# Patient Record
Sex: Male | Born: 1950 | Race: White | Hispanic: No | Marital: Married | Smoking: Never smoker
Health system: Southern US, Community
[De-identification: ages and names within clinical notes are randomized; demographics above are authoritative.]

## PROBLEM LIST (undated history)

## (undated) DIAGNOSIS — E7800 Pure hypercholesterolemia, unspecified: Secondary | ICD-10-CM

## (undated) DIAGNOSIS — I10 Essential (primary) hypertension: Secondary | ICD-10-CM

## (undated) HISTORY — PX: SPLENECTOMY: SUR1306

---

## 2011-03-16 DIAGNOSIS — D229 Melanocytic nevi, unspecified: Secondary | ICD-10-CM

## 2011-03-16 DIAGNOSIS — C4491 Basal cell carcinoma of skin, unspecified: Secondary | ICD-10-CM

## 2011-03-16 HISTORY — DX: Melanocytic nevi, unspecified: D22.9

## 2011-03-16 HISTORY — DX: Basal cell carcinoma of skin, unspecified: C44.91

## 2011-12-02 DIAGNOSIS — C4491 Basal cell carcinoma of skin, unspecified: Secondary | ICD-10-CM

## 2011-12-02 HISTORY — DX: Basal cell carcinoma of skin, unspecified: C44.91

## 2016-07-03 ENCOUNTER — Emergency Department (HOSPITAL_COMMUNITY): Payer: Managed Care, Other (non HMO)

## 2016-07-03 ENCOUNTER — Observation Stay (HOSPITAL_COMMUNITY)
Admission: EM | Admit: 2016-07-03 | Discharge: 2016-07-04 | Disposition: A | Discharge status: Home / Self Care | Payer: Managed Care, Other (non HMO) | Attending: Internal Medicine | Admitting: Internal Medicine

## 2016-07-03 ENCOUNTER — Encounter (HOSPITAL_COMMUNITY): Payer: Self-pay | Admitting: Emergency Medicine

## 2016-07-03 DIAGNOSIS — I10 Essential (primary) hypertension: Secondary | ICD-10-CM | POA: Diagnosis not present

## 2016-07-03 DIAGNOSIS — Z23 Encounter for immunization: Secondary | ICD-10-CM | POA: Diagnosis not present

## 2016-07-03 DIAGNOSIS — T63441A Toxic effect of venom of bees, accidental (unintentional), initial encounter: Principal | ICD-10-CM | POA: Insufficient documentation

## 2016-07-03 DIAGNOSIS — R079 Chest pain, unspecified: Secondary | ICD-10-CM | POA: Insufficient documentation

## 2016-07-03 DIAGNOSIS — Z8249 Family history of ischemic heart disease and other diseases of the circulatory system: Secondary | ICD-10-CM | POA: Insufficient documentation

## 2016-07-03 DIAGNOSIS — N179 Acute kidney failure, unspecified: Secondary | ICD-10-CM | POA: Diagnosis not present

## 2016-07-03 DIAGNOSIS — Z87891 Personal history of nicotine dependence: Secondary | ICD-10-CM | POA: Diagnosis not present

## 2016-07-03 DIAGNOSIS — R7989 Other specified abnormal findings of blood chemistry: Secondary | ICD-10-CM | POA: Diagnosis not present

## 2016-07-03 DIAGNOSIS — E7800 Pure hypercholesterolemia, unspecified: Secondary | ICD-10-CM | POA: Diagnosis not present

## 2016-07-03 DIAGNOSIS — T782XXA Anaphylactic shock, unspecified, initial encounter: Secondary | ICD-10-CM

## 2016-07-03 DIAGNOSIS — E785 Hyperlipidemia, unspecified: Secondary | ICD-10-CM | POA: Diagnosis not present

## 2016-07-03 HISTORY — DX: Pure hypercholesterolemia, unspecified: E78.00

## 2016-07-03 HISTORY — DX: Essential (primary) hypertension: I10

## 2016-07-03 LAB — CBC WITH DIFFERENTIAL/PLATELET
BASOS PCT: 0 %
Basophils Absolute: 0 10*3/uL (ref 0.0–0.1)
Eosinophils Absolute: 0.1 10*3/uL (ref 0.0–0.7)
Eosinophils Relative: 1 %
HEMATOCRIT: 46.6 % (ref 39.0–52.0)
HEMOGLOBIN: 15.3 g/dL (ref 13.0–17.0)
LYMPHS ABS: 3.9 10*3/uL (ref 0.7–4.0)
LYMPHS PCT: 35 %
MCH: 30.5 pg (ref 26.0–34.0)
MCHC: 32.8 g/dL (ref 30.0–36.0)
MCV: 92.8 fL (ref 78.0–100.0)
MONO ABS: 0.3 10*3/uL (ref 0.1–1.0)
MONOS PCT: 3 %
NEUTROS ABS: 6.7 10*3/uL (ref 1.7–7.7)
Neutrophils Relative %: 61 %
Platelets: 275 10*3/uL (ref 150–400)
RBC: 5.02 MIL/uL (ref 4.22–5.81)
RDW: 13.4 % (ref 11.5–15.5)
WBC: 11 10*3/uL — ABNORMAL HIGH (ref 4.0–10.5)

## 2016-07-03 LAB — COMPREHENSIVE METABOLIC PANEL
ALK PHOS: 63 U/L (ref 38–126)
ALT: 29 U/L (ref 17–63)
AST: 27 U/L (ref 15–41)
Albumin: 3.3 g/dL — ABNORMAL LOW (ref 3.5–5.0)
Anion gap: 12 (ref 5–15)
BILIRUBIN TOTAL: 0.6 mg/dL (ref 0.3–1.2)
BUN: 18 mg/dL (ref 6–20)
CALCIUM: 8.8 mg/dL — AB (ref 8.9–10.3)
CO2: 19 mmol/L — AB (ref 22–32)
CREATININE: 1.32 mg/dL — AB (ref 0.61–1.24)
Chloride: 111 mmol/L (ref 101–111)
GFR, EST NON AFRICAN AMERICAN: 55 mL/min — AB (ref >60)
Glucose, Bld: 129 mg/dL — ABNORMAL HIGH (ref 65–99)
Potassium: 3.5 mmol/L (ref 3.5–5.1)
Sodium: 142 mmol/L (ref 135–145)
Total Protein: 6.3 g/dL — ABNORMAL LOW (ref 6.5–8.1)

## 2016-07-03 LAB — I-STAT TROPONIN, ED: Troponin i, poc: 0.15 ng/mL (ref 0.00–0.08)

## 2016-07-03 LAB — ETHANOL: Alcohol, Ethyl (B): <5 mg/dL (ref <5)

## 2016-07-03 LAB — TROPONIN I: TROPONIN I: 0.86 ng/mL — AB (ref <0.03)

## 2016-07-03 MED ORDER — ASPIRIN EC 81 MG PO TBEC
81.0000 mg | DELAYED_RELEASE_TABLET | Freq: Every day | ORAL | Status: DC
  Administered 2016-07-04: 81 mg via ORAL
  Filled 2016-07-03: qty 1

## 2016-07-03 MED ORDER — SODIUM CHLORIDE 0.9 % IV BOLUS (SEPSIS)
500.0000 mL | Freq: Once | INTRAVENOUS | Status: AC
Start: 2016-07-03 — End: 2016-07-03
  Administered 2016-07-03: 500 mL via INTRAVENOUS

## 2016-07-03 MED ORDER — SODIUM CHLORIDE 0.9 % IV BOLUS (SEPSIS)
1000.0000 mL | Freq: Once | INTRAVENOUS | Status: AC
  Administered 2016-07-03: 1000 mL via INTRAVENOUS

## 2016-07-03 MED ORDER — ENOXAPARIN SODIUM 40 MG/0.4ML ~~LOC~~ SOLN
40.0000 mg | Freq: Every day | SUBCUTANEOUS | Status: DC
  Administered 2016-07-03: 40 mg via SUBCUTANEOUS
  Filled 2016-07-03: qty 0.4

## 2016-07-03 MED ORDER — ACETAMINOPHEN 325 MG PO TABS: 650.0000 mg | ORAL_TABLET | Freq: Four times a day (QID) | ORAL | Status: DC | PRN

## 2016-07-03 MED ORDER — METHYLPREDNISOLONE SODIUM SUCC 125 MG IJ SOLR
125.0000 mg | Freq: Once | INTRAMUSCULAR | Status: AC
  Administered 2016-07-03: 125 mg via INTRAVENOUS
  Filled 2016-07-03: qty 2

## 2016-07-03 MED ORDER — SODIUM CHLORIDE 0.9% FLUSH
3.0000 mL | Freq: Two times a day (BID) | INTRAVENOUS | Status: DC
  Administered 2016-07-03 – 2016-07-04 (×2): 3 mL via INTRAVENOUS

## 2016-07-03 MED ORDER — DIPHENHYDRAMINE HCL 50 MG/ML IJ SOLN
25.0000 mg | Freq: Once | INTRAMUSCULAR | Status: AC
  Administered 2016-07-03: 25 mg via INTRAVENOUS
  Filled 2016-07-03: qty 1

## 2016-07-03 MED ORDER — METHYLPREDNISOLONE SODIUM SUCC 125 MG IJ SOLR
60.0000 mg | Freq: Four times a day (QID) | INTRAMUSCULAR | Status: DC
  Administered 2016-07-03 – 2016-07-04 (×3): 60 mg via INTRAVENOUS
  Filled 2016-07-03 (×3): qty 2

## 2016-07-03 MED ORDER — HYDROCODONE-ACETAMINOPHEN 5-325 MG PO TABS: 1.0000 | ORAL_TABLET | ORAL | Status: DC | PRN

## 2016-07-03 MED ORDER — ONDANSETRON HCL 4 MG/2ML IJ SOLN: 4.0000 mg | Freq: Four times a day (QID) | INTRAMUSCULAR | Status: DC | PRN

## 2016-07-03 MED ORDER — PNEUMOCOCCAL VAC POLYVALENT 25 MCG/0.5ML IJ INJ
0.5000 mL | INJECTION | INTRAMUSCULAR | Status: AC
  Administered 2016-07-04: 0.5 mL via INTRAMUSCULAR
  Filled 2016-07-03: qty 0.5

## 2016-07-03 MED ORDER — ACETAMINOPHEN 650 MG RE SUPP: 650.0000 mg | Freq: Four times a day (QID) | RECTAL | Status: DC | PRN

## 2016-07-03 MED ORDER — ONDANSETRON HCL 4 MG PO TABS: 4.0000 mg | ORAL_TABLET | Freq: Four times a day (QID) | ORAL | Status: DC | PRN

## 2016-07-03 MED ORDER — ACETAMINOPHEN 325 MG PO TABS
650.0000 mg | ORAL_TABLET | ORAL | Status: DC | PRN
Start: 2016-07-03 — End: 2016-07-03

## 2016-07-03 MED ORDER — DIPHENHYDRAMINE HCL 50 MG/ML IJ SOLN: 25.0000 mg | Freq: Three times a day (TID) | INTRAMUSCULAR | Status: DC | PRN

## 2016-07-03 MED ORDER — DIPHENHYDRAMINE HCL 50 MG/ML IJ SOLN
12.5000 mg | Freq: Four times a day (QID) | INTRAMUSCULAR | Status: DC
  Administered 2016-07-03 – 2016-07-04 (×3): 12.5 mg via INTRAVENOUS
  Filled 2016-07-03 (×3): qty 1

## 2016-07-03 MED ORDER — MORPHINE SULFATE (PF) 2 MG/ML IV SOLN: 2.0000 mg | INTRAVENOUS | Status: DC | PRN

## 2016-07-03 MED ORDER — EPINEPHRINE 0.3 MG/0.3ML IJ SOAJ
0.3000 mg | INTRAMUSCULAR | Status: DC | PRN
  Filled 2016-07-03: qty 0.3

## 2016-07-03 MED ORDER — FAMOTIDINE IN NACL 20-0.9 MG/50ML-% IV SOLN
20.0000 mg | Freq: Two times a day (BID) | INTRAVENOUS | Status: DC
Start: 2016-07-03 — End: 2016-07-04
  Administered 2016-07-03 – 2016-07-04 (×2): 20 mg via INTRAVENOUS
  Filled 2016-07-03 (×2): qty 50

## 2016-07-03 MED ORDER — SODIUM CHLORIDE 0.9 % IV SOLN
INTRAVENOUS | Status: AC
  Administered 2016-07-03: 23:00:00 via INTRAVENOUS

## 2016-07-03 NOTE — Consult Note (Addendum)
Admit date: 07/03/2016 Referring Physician  Dr. Roel Cluck Primary Physician  None Primary Cardiologist  None Reason for Consultation  Chest pain with anaphylaxis  HPI: This is a 65yo male with no prior cardiac history but a history of hyperlipidemia and HTN who presented to [facility] ER with anaphylaxis after being stung by a bee.  He was out mowing his lawn and was stung by one bee and immediately he became very diaphoretic with severe SOB and facial swelling. He says that he could not breath at all and started having chest tightness.  His wife says that he started to turn blue.  She was able to get a small amount of benadryl in him but then he stopped breathing and turned blue and eye rolled back in his head.  Fortunately EMS was driving down his street when they got the call and on arrival  he was hypotensive and was given epinephrine. He regained consciousness and was transported to ER.   In ER hypotension resolved but he has some residual facial swelling.  WBC 11, creatinine 1.32.  Initial POC trop was 0.15.  Currently pain free.  EKG is nonischemic.  Cardiology is asked to evaluate for further workup of elevated troponin.  He says that he has been allergic to bee stings in the past and each time his reaction is more severe.       PMH:   Past Medical History  Diagnosis Date  . High cholesterol   . Hypertension      PSH:   Past Surgical History  Procedure Laterality Date  . Splenectomy      Allergies:  Bee venom Prior to Admit Meds:   (Not in a hospital admission) Fam HX:    Family History  Problem Relation Age of Onset  . Diabetes Mother   . Rheumatic fever Father   . CAD Other    Social HX:    Social History   Social History  . Marital Status: Unknown    Spouse Name: N/A  . Number of Children: N/A  . Years of Education: N/A   Occupational History  . Not on file.   Social History Main Topics  . Smoking status: Never Smoker   . Smokeless tobacco: Not on file  . Alcohol  Use: Yes     Comment: socially  . Drug Use: No  . Sexual Activity: Not on file   Other Topics Concern  . Not on file   Social History Narrative  . No narrative on file     ROS:  All 11 ROS were addressed and are negative except what is stated in the HPI  Physical Exam: Blood pressure 109/69, pulse 71, temperature 97.6 F (36.4 C), temperature source Oral, resp. rate 17, height 5\' 8"  (1.727 m), weight 220 lb (99.791 kg), SpO2 97 %.    General: Well developed, well nourished, in no acute distress Head: Eyes PERRLA, No xanthomas.   Normal cephalic and atramatic  Lungs:   Clear bilaterally to auscultation and percussion. Heart:  HRRR S1 S2 Pulses are 2+ & equal.            No carotid bruit. No JVD.  No abdominal bruits. No femoral bruits. Abdomen: Bowel sounds are positive, abdomen soft and non-tender without masses  Msk:  Back normal, normal gait. Normal strength and tone for age. Extremities:   No clubbing, cyanosis or edema.  DP +1 Neuro: Alert and oriented X 3. Psych:  Good affect, responds appropriately  Labs:   Lab Results  Component Value Date   WBC 11.0* 07/03/2016   HGB 15.3 07/03/2016   HCT 46.6 07/03/2016   MCV 92.8 07/03/2016   PLT 275 07/03/2016    Recent Labs Lab 07/03/16 1818  NA 142  K 3.5  CL 111  CO2 19*  BUN 18  CREATININE 1.32*  CALCIUM 8.8*  PROT 6.3*  BILITOT 0.6  ALKPHOS 63  ALT 29  AST 27  GLUCOSE 129*   No results found for: PTT No results found for: INR, PROTIME Lab Results  Component Value Date   TROPONINI 0.86* 07/03/2016    No results found for: CHOL No results found for: HDL No results found for: LDLCALC No results found for: TRIG No results found for: CHOLHDL No results found for: LDLDIRECT    Radiology:  Dg Chest Port 1 View  07/03/2016  CLINICAL DATA:  Shortness of breath. EXAM: PORTABLE CHEST 1 VIEW COMPARISON:  None. FINDINGS: Heart size is normal. Aortic atherosclerosis noted. No pleural effusion identified.  Atelectasis is noted in the left lung base. No airspace consolidation. IMPRESSION: 1. Left base atelectasis. Electronically Signed   By: Kerby Moors M.D.   On: 07/03/2016 18:12    EKG:  NSR with no ST changes  ASSESSMENT/PLAN:   1.  Severe anaphylaxis with associated hypotension and near arrest after bee sting s/p epinephrine with residual facial swelling. 2.  Chest pain with elevated troponin at 0.15.  This is most likely related to demand ischemia in the setting of acute anaphylaxis, hypotension and epinephrine.  He is now pain free.  Second trop mildly elevated at 0.86.  EKG is nonischemic.  I do not think this represents an acute coronary syndrome. No IV Heparin for now. Would continue to cycle enzymes.  Check 2D echo in am to assess LVF.  His cardiac risk factors include HTN, family history of his father having a CABG at 30yo, and dyslipidemia.  He also used to smoke but quit 15 years ago.  If troponin remains flat and echo is normal, recommend outpt nuclear stress test.  3.  HTN - controlled on no home meds.  Fransico Him, MD  07/03/2016  9:23 PM

## 2016-07-03 NOTE — H&P (Signed)
Andrew Heath [id number] DOB: 06/16/1951 DOA: 07/03/2016     PCP: Verlon [facility] Outpatient Specialists: none Patient coming from:    home Lives   With family    Chief Complaint: bee sting and chest pain  HPI: Andrew Heath is a 65 y.o. male with medical history significant of mild hypercholesteremia    Presented after he was stung by P today while mowing the yard. Family noticed him to be pale and diaphoretic With  hives EMS was called and patient was noted to be hypotensive incontinent of bowels and bladder. Wife noted he was turning blue. He was pointing to his chest. She gave him liquid benadryl.  He states he was very confused out of strength and felt he could not get any air.  Patient received 0.3 of EPinephrine at 1647 thereafter he developed chest pain received aspirin 300 2450 of Benadryl after that hives have resolved he was able to speak in complete sentences and was brought to emergency department.   Patient states he has been bit by yellow jacket in the past and developed milder reaction. Patient reports that he is very active placed tenderness and does not have any exertional chest pain or shortness of breath at baseline. Wife states that a year ago he collapsed while playing tennis but patient states that was secondary to heat exertion. He denies any history of coronary artery disease hypertension no family history of early deaths   IN ER: Afebrile heart rate 65 respirations 15 blood pressure 143/67 oxygen saturation 100% on room air WBC 11.0 hemoglobin 15.5 bicarbonate 19 creatinine 1.3 to glucose 129 Troponin elevated 0.15  X-ray showing left basilar atelectasis Cardiology has been consulted and provider spoke with Dr. Radford Pax who recommends repeating troponin and she was seen in consult Hospitalist was called for admission for chest pain and elevated troponin  Review of Systems:    Pertinent positives include: chest pain,   Constitutional:  No  weight loss, night sweats, Fevers, chills, fatigue, weight loss  HEENT:  No headaches, Difficulty swallowing,Tooth/dental problems,Sore throat,  No sneezing, itching, ear ache, nasal congestion, post nasal drip,  Cardio-vascular:  No Orthopnea, PND, anasarca, dizziness, palpitations.no Bilateral lower extremity swelling  GI:  No heartburn, indigestion, abdominal pain, nausea, vomiting, diarrhea, change in bowel habits, loss of appetite, melena, blood in stool, hematemesis Resp:  no shortness of breath at rest. No dyspnea on exertion, No excess mucus, no productive cough, No non-productive cough, No coughing up of blood.No change in color of mucus.No wheezing. Skin:  no rash or lesions. No jaundice GU:  no dysuria, change in color of urine, no urgency or frequency. No straining to urinate.  No flank pain.  Musculoskeletal:  No joint pain or no joint swelling. No decreased range of motion. No back pain.  Psych:  No change in mood or affect. No depression or anxiety. No memory loss.  Neuro: no localizing neurological complaints, no tingling, no weakness, no double vision, no gait abnormality, no slurred speech, no confusion  As per HPI otherwise 10 point review of systems negative.   Past Medical History: Past Medical History  Diagnosis Date  . High cholesterol   . Hypertension    Past Surgical History  Procedure Laterality Date  . Splenectomy       Social History:  Ambulatory  independently      reports that he has never smoked. He does not have any smokeless tobacco history on file. He reports that he drinks alcohol.  He reports that he does not use illicit drugs.  Allergies:   Allergies  Allergen Reactions  . Bee Venom     Family History:    Family History  Problem Relation Age of Onset  . Diabetes Mother   . Rheumatic fever Father   . CAD Other     Medications: Prior to Admission medications   Not on File    Physical Exam: Patient Vitals for the past 24  hrs:  BP Temp Temp src Pulse Resp SpO2 Height Weight  07/03/16 1900 143/67 mmHg - - 65 15 100 % - -  07/03/16 1830 129/79 mmHg - - 63 17 99 % - -  07/03/16 1800 156/76 mmHg - - 64 20 98 % - -  07/03/16 1749 172/80 mmHg 97.6 F (36.4 C) Oral 62 14 97 % - -  07/03/16 1747 - - - - - - 5\' 8"  (1.727 m) 99.791 kg (220 lb)  07/03/16 1742 - - - - - 93 % - -    1. General:  in No Acute distress 2. Psychological: Alert and   Oriented 3. Head/ENT:     Dry Mucous Membranes                          Head Non traumatic, neck supple                          Normal Dentition 4. SKIN:   decreased Skin turgor,  Skin clean Dry and intact no rash, no hives noted some skin damage from sun noted 5. Heart: Regular rate and rhythm no  Murmur, Rub or gallop 6. Lungs: Clear to auscultation bilaterally, no wheezes or crackles   7. Abdomen: Soft, non-tender, Non distended 8. Lower extremities: no clubbing, cyanosis, or edema 9. Neurologically Grossly intact, moving all 4 extremities equally 10. MSK: Normal range of motion   body mass index is 33.46 kg/(m^2).  Labs on Admission:   Labs on Admission: I have personally reviewed following labs and imaging studies  CBC:  Recent Labs Lab 07/03/16 1818  WBC 11.0*  NEUTROABS 6.7  HGB 15.3  HCT 46.6  MCV 92.8  PLT 123XX123   Basic Metabolic Panel:  Recent Labs Lab 07/03/16 1818  NA 142  K 3.5  CL 111  CO2 19*  GLUCOSE 129*  BUN 18  CREATININE 1.32*  CALCIUM 8.8*   GFR: Estimated Creatinine Clearance: 63.1 mL/min (by C-G formula based on Cr of 1.32). Liver Function Tests:  Recent Labs Lab 07/03/16 1818  AST 27  ALT 29  ALKPHOS 63  BILITOT 0.6  PROT 6.3*  ALBUMIN 3.3*   No results for input(s): LIPASE, AMYLASE in the last 168 hours. No results for input(s): AMMONIA in the last 168 hours. Coagulation Profile: No results for input(s): INR, PROTIME in the last 168 hours. Cardiac Enzymes: No results for input(s): CKTOTAL, CKMB, CKMBINDEX,  TROPONINI in the last 168 hours. BNP (last 3 results) No results for input(s): PROBNP in the last 8760 hours. HbA1C: No results for input(s): HGBA1C in the last 72 hours. CBG: No results for input(s): GLUCAP in the last 168 hours. Lipid Profile: No results for input(s): CHOL, HDL, LDLCALC, TRIG, CHOLHDL, LDLDIRECT in the last 72 hours. Thyroid Function Tests: No results for input(s): TSH, T4TOTAL, FREET4, T3FREE, THYROIDAB in the last 72 hours. Anemia Panel: No results for input(s): VITAMINB12, FOLATE, FERRITIN, TIBC, IRON, RETICCTPCT in the  last 72 hours. Urine analysis: No results found for: COLORURINE, APPEARANCEUR, LABSPEC, PHURINE, GLUCOSEU, HGBUR, BILIRUBINUR, KETONESUR, PROTEINUR, UROBILINOGEN, NITRITE, LEUKOCYTESUR Sepsis Labs: @LABRCNTIP (procalcitonin:4,lacticidven:4) )No results found for this or any previous visit (from the past 240 hour(s)).    UA not obtained  No results found for: HGBA1C  Estimated Creatinine Clearance: 63.1 mL/min (by C-G formula based on Cr of 1.32).  BNP (last 3 results) No results for input(s): PROBNP in the last 8760 hours.   ECG REPORT  Independently reviewed Rate: 63  Rhythm: Sinus rhythm ST&T Change: No acute ischemic changes   QTC 434  Filed Weights   07/03/16 1747  Weight: 99.791 kg (220 lb)     Cultures: No results found for: SDES, SPECREQUEST, CULT, REPTSTATUS   Radiological Exams on Admission: Dg Chest Port 1 View  07/03/2016  CLINICAL DATA:  Shortness of breath. EXAM: PORTABLE CHEST 1 VIEW COMPARISON:  None. FINDINGS: Heart size is normal. Aortic atherosclerosis noted. No pleural effusion identified. Atelectasis is noted in the left lung base. No airspace consolidation. IMPRESSION: 1. Left base atelectasis. Electronically Signed   By: Kerby Moors M.D.   On: 07/03/2016 18:12    Chart has been reviewed    Assessment/Plan  65 y.o. male with medical history significant of mild hypercholesteremia here with anaphylactic  reaction secondary to bee sting with associated chest pain and elevated troponin  Present on Admission:  . Chest pain - any setting of anaphylactic reaction. Currently chest pain-free. We'll monitor carefully and step down continue to cycle cardiac enzymes appreciate cardiology consult, monitor on telemetry obtain echogram.  . Anaphylactic reaction  to bee sting- will admit to step down to monitor for recurrent reaction. Continue steroids scheduled Solu-Medrol, Pepcid, Benadryl, EpiPen at bedside, rehydrate aggressively, patient will need prescription for EpiPen at the time of discharge.  . Hypercholesteremia we'll check lipid panel   elevated troponin in a setting of anaphylactic reaction versus possible shock. Unsure which show ACS. Appreciate cardiology consult. Hold off on heparinization at this point.  . Acute renal injury ([place]) low rehydrate and check urine electrolytes  Other plan as per orders.  DVT prophylaxis:    Lovenox     Code Status:  FULL CODE  as per patient    Family Communication:   Family  at  Bedside  plan of care was discussed with  Wife Andrew Heath [id number]  Disposition Plan:      To home once workup is complete and patient is stable   Consults called: Cardiology   Admission status:    obs    Level of care    stepdown   I have spent a total of 57 min on this admission    Alaine Loughney 07/03/2016, 9:02 PM    [facility]  Pager [phone]   after 2 AM please page floor coverage PA If 7AM-7PM, please contact the day team taking care of the patient  [email or web address]  Password [email or web address]

## 2016-07-03 NOTE — ED Notes (Signed)
Patient notified of delay - awaiting bed assignment.

## 2016-07-03 NOTE — ED Provider Notes (Signed)
CSN: [id number]     Arrival date & time 07/03/16  61 History   First MD Initiated Contact with Patient 07/03/16 1747     Chief Complaint  Patient presents with  . Allergic Reaction  . Chest Pain     (Consider location/radiation/quality/duration/timing/severity/associated sxs/prior Treatment) Patient is a 65 y.o. male presenting with allergic reaction and chest pain. The history is provided by the patient (The patient was stung by bees and became diaphoretic and hypotensive. He was given epinephrine by the paramedics. Started to have some chest pain in the ambulance.).  Allergic Reaction Presenting symptoms: difficulty breathing and rash   Severity:  Severe Prior allergic episodes:  Insect allergies Context: no animal exposure   Relieved by: Epinephrine. Worsened by:  Nothing tried Chest Pain Associated symptoms: no abdominal pain, no back pain, no cough, no fatigue and no headache     Past Medical History  Diagnosis Date  . High cholesterol   . Hypertension    Past Surgical History  Procedure Laterality Date  . Splenectomy     History reviewed. No pertinent family history. Social History  Substance Use Topics  . Smoking status: Never Smoker   . Smokeless tobacco: None  . Alcohol Use: Yes     Comment: socially    Review of Systems  Constitutional: Negative for appetite change and fatigue.  HENT: Negative for congestion, ear discharge and sinus pressure.   Eyes: Negative for discharge.  Respiratory: Negative for cough.   Cardiovascular: Positive for chest pain.  Gastrointestinal: Negative for abdominal pain and diarrhea.  Genitourinary: Negative for frequency and hematuria.  Musculoskeletal: Negative for back pain.  Skin: Positive for rash.  Neurological: Negative for seizures and headaches.  Psychiatric/Behavioral: Negative for hallucinations.      Allergies  Bee venom  Home Medications   Prior to Admission medications   Not on File   BP 132/70 mmHg   Pulse 66  Temp(Src) 97.6 F (36.4 C) (Oral)  Resp 19  Ht 5\' 8"  (1.727 m)  Wt 220 lb (99.791 kg)  BMI 33.46 kg/m2  SpO2 96% Physical Exam  Constitutional: He is oriented to person, place, and time. He appears well-developed.  HENT:  Head: Normocephalic.  Swelling around face  Eyes: Conjunctivae and EOM are normal. No scleral icterus.  Neck: Neck supple. No thyromegaly present.  Cardiovascular: Normal rate and regular rhythm.  Exam reveals no gallop and no friction rub.   No murmur heard. Pulmonary/Chest: No stridor. He has no wheezes. He has no rales. He exhibits no tenderness.  Abdominal: He exhibits no distension. There is no tenderness. There is no rebound.  Musculoskeletal: Normal range of motion. He exhibits no edema.  Lymphadenopathy:    He has no cervical adenopathy.  Neurological: He is oriented to person, place, and time. He exhibits normal muscle tone. Coordination normal.  Skin: Rash noted. No erythema.  Psychiatric: He has a normal mood and affect. His behavior is normal.    ED Course  Procedures (including critical care time) Labs Review Labs Reviewed  CBC WITH DIFFERENTIAL/PLATELET - Abnormal; Notable for the following:    WBC 11.0 (*)    All other components within normal limits  COMPREHENSIVE METABOLIC PANEL - Abnormal; Notable for the following:    CO2 19 (*)    Glucose, Bld 129 (*)    Creatinine, Ser 1.32 (*)    Calcium 8.8 (*)    Total Protein 6.3 (*)    Albumin 3.3 (*)    GFR calc  non Af Amer 55 (*)    All other components within normal limits  I-STAT TROPOININ, ED - Abnormal; Notable for the following:    Troponin i, poc 0.15 (*)    All other components within normal limits  ETHANOL  TROPONIN I    Imaging Review Dg Chest Port 1 View  07/03/2016  CLINICAL DATA:  Shortness of breath. EXAM: PORTABLE CHEST 1 VIEW COMPARISON:  None. FINDINGS: Heart size is normal. Aortic atherosclerosis noted. No pleural effusion identified. Atelectasis is noted in  the left lung base. No airspace consolidation. IMPRESSION: 1. Left base atelectasis. Electronically Signed   By: Kerby Moors M.D.   On: 07/03/2016 18:12   I have personally reviewed and evaluated these images and lab results as part of my medical decision-making.   EKG Interpretation None      MDM   Final diagnoses:  None    Patient with an anaphylactic reaction to bee sting. Also elevated troponin. Patient will be admitted to the hospital for further evaluation    Milton Ferguson, MD 07/03/16 2030

## 2016-07-03 NOTE — ED Notes (Signed)
Per EMS- Pt was mowing the lawn and got stung by a bee. Pt was weak, hypotensive, diaphoretic, hives. Pt also was incontinent of bowels and bladder.Pt received 0.3 epi at 1647, 324 ASA, 50 benadryl. Pt speaking in complete sentences at this time, no visible rash.

## 2016-07-03 NOTE — ED Notes (Signed)
Admitting MD at bedside.

## 2016-07-04 ENCOUNTER — Observation Stay (HOSPITAL_COMMUNITY): Payer: Managed Care, Other (non HMO)

## 2016-07-04 ENCOUNTER — Other Ambulatory Visit: Payer: Self-pay | Admitting: Cardiology

## 2016-07-04 ENCOUNTER — Observation Stay (HOSPITAL_BASED_OUTPATIENT_CLINIC_OR_DEPARTMENT_OTHER): Payer: Managed Care, Other (non HMO)

## 2016-07-04 DIAGNOSIS — R079 Chest pain, unspecified: Secondary | ICD-10-CM

## 2016-07-04 DIAGNOSIS — N179 Acute kidney failure, unspecified: Secondary | ICD-10-CM

## 2016-07-04 DIAGNOSIS — R778 Other specified abnormalities of plasma proteins: Secondary | ICD-10-CM

## 2016-07-04 DIAGNOSIS — E7800 Pure hypercholesterolemia, unspecified: Secondary | ICD-10-CM

## 2016-07-04 DIAGNOSIS — T63441A Toxic effect of venom of bees, accidental (unintentional), initial encounter: Secondary | ICD-10-CM | POA: Diagnosis not present

## 2016-07-04 DIAGNOSIS — R7989 Other specified abnormal findings of blood chemistry: Principal | ICD-10-CM

## 2016-07-04 DIAGNOSIS — T782XXA Anaphylactic shock, unspecified, initial encounter: Secondary | ICD-10-CM

## 2016-07-04 LAB — COMPREHENSIVE METABOLIC PANEL
ALK PHOS: 57 U/L (ref 38–126)
ALT: 30 U/L (ref 17–63)
AST: 29 U/L (ref 15–41)
Albumin: 3.5 g/dL (ref 3.5–5.0)
Anion gap: 9 (ref 5–15)
BUN: 19 mg/dL (ref 6–20)
CHLORIDE: 111 mmol/L (ref 101–111)
CO2: 19 mmol/L — AB (ref 22–32)
CREATININE: 1.05 mg/dL (ref 0.61–1.24)
Calcium: 8.8 mg/dL — ABNORMAL LOW (ref 8.9–10.3)
GFR calc non Af Amer: >60 mL/min (ref >60)
GLUCOSE: 141 mg/dL — AB (ref 65–99)
Potassium: 4.5 mmol/L (ref 3.5–5.1)
Sodium: 139 mmol/L (ref 135–145)
Total Bilirubin: 0.7 mg/dL (ref 0.3–1.2)
Total Protein: 6.2 g/dL — ABNORMAL LOW (ref 6.5–8.1)

## 2016-07-04 LAB — CBC
HCT: 42.6 % (ref 39.0–52.0)
HEMOGLOBIN: 13.9 g/dL (ref 13.0–17.0)
MCH: 30.4 pg (ref 26.0–34.0)
MCHC: 32.6 g/dL (ref 30.0–36.0)
MCV: 93.2 fL (ref 78.0–100.0)
PLATELETS: 293 10*3/uL (ref 150–400)
RBC: 4.57 MIL/uL (ref 4.22–5.81)
RDW: 13.8 % (ref 11.5–15.5)
WBC: 19 10*3/uL — ABNORMAL HIGH (ref 4.0–10.5)

## 2016-07-04 LAB — PHOSPHORUS: PHOSPHORUS: 1.8 mg/dL — AB (ref 2.5–4.6)

## 2016-07-04 LAB — URINALYSIS, ROUTINE W REFLEX MICROSCOPIC
Bilirubin Urine: NEGATIVE
Glucose, UA: NEGATIVE mg/dL
Hgb urine dipstick: NEGATIVE
KETONES UR: NEGATIVE mg/dL
LEUKOCYTES UA: NEGATIVE
NITRITE: NEGATIVE
PROTEIN: NEGATIVE mg/dL
Specific Gravity, Urine: 1.03 (ref 1.005–1.030)
pH: 5.5 (ref 5.0–8.0)

## 2016-07-04 LAB — LIPID PANEL
Cholesterol: 204 mg/dL — ABNORMAL HIGH (ref 0–200)
HDL: 54 mg/dL (ref >40)
LDL Cholesterol: 139 mg/dL — ABNORMAL HIGH (ref 0–99)
TRIGLYCERIDES: 57 mg/dL (ref <150)
Total CHOL/HDL Ratio: 3.8 RATIO
VLDL: 11 mg/dL (ref 0–40)

## 2016-07-04 LAB — ECHOCARDIOGRAM COMPLETE
HEIGHTINCHES: 68 in
Weight: 3520 oz

## 2016-07-04 LAB — TROPONIN I
Troponin I: 0.68 ng/mL (ref <0.03)
Troponin I: 0.52 ng/mL (ref <0.03)
Troponin I: 0.31 ng/mL (ref <0.03)

## 2016-07-04 LAB — CREATININE, URINE, RANDOM: CREATININE, URINE: 227.52 mg/dL

## 2016-07-04 LAB — MAGNESIUM: MAGNESIUM: 1.9 mg/dL (ref 1.7–2.4)

## 2016-07-04 LAB — MRSA PCR SCREENING: MRSA by PCR: NEGATIVE

## 2016-07-04 LAB — TSH: TSH: 0.812 u[IU]/mL (ref 0.350–4.500)

## 2016-07-04 LAB — SODIUM, URINE, RANDOM: SODIUM UR: 80 mmol/L

## 2016-07-04 MED ORDER — ASPIRIN 81 MG PO TBEC: 81.0000 mg | DELAYED_RELEASE_TABLET | Freq: Every day | ORAL | Status: AC

## 2016-07-04 MED ORDER — EPINEPHRINE 0.3 MG/0.3ML IJ SOAJ: 0.3000 mg | INTRAMUSCULAR | Status: AC | PRN

## 2016-07-04 MED ORDER — PREDNISONE 10 MG PO TABS: 10.0000 mg | ORAL_TABLET | Freq: Every day | ORAL | Status: DC

## 2016-07-04 MED ORDER — FAMOTIDINE 20 MG PO TABS: 20.0000 mg | ORAL_TABLET | Freq: Two times a day (BID) | ORAL | Status: AC

## 2016-07-04 NOTE — Progress Notes (Signed)
This RN paged echo lab multiple times with no response. Dr. Aundra Dubin paged to verify if cardiology needed to call them in on the weekend. Per Dr. Aundra Dubin, cards no longer has to call them in, they are already in house. Orders changed for pending discharge. Will continue to check status of echo tech.

## 2016-07-04 NOTE — Progress Notes (Signed)
Patient ID: Andrew Heath, male   DOB: May 03, 1951, 65 y.o.   MRN: [id number]   SUBJECTIVE: Patient feels back to normal this morning.  No facial swelling.  BP ok. Peak TnI 0.86.   Scheduled Meds: . aspirin EC  81 mg Oral Daily  . diphenhydrAMINE  12.5 mg Intravenous Q6H  . enoxaparin (LOVENOX) injection  40 mg Subcutaneous QHS  . famotidine (PEPCID) IV  20 mg Intravenous Q12H  . methylPREDNISolone (SOLU-MEDROL) injection  60 mg Intravenous Q6H  . sodium chloride flush  3 mL Intravenous Q12H   Continuous Infusions:  PRN Meds:.acetaminophen **OR** acetaminophen, diphenhydrAMINE, EPINEPHrine, HYDROcodone-acetaminophen, morphine injection, ondansetron **OR** ondansetron (ZOFRAN) IV    Filed Vitals:   07/04/16 0200 07/04/16 0349 07/04/16 0400 07/04/16 0749  BP: 99/56 99/56 96/52  109/56  Pulse: 60 63 58 58  Temp:  98 F (36.7 C)  97.8 F (36.6 C)  TempSrc:  Oral  Oral  Resp: 14 14 14 18   Height:      Weight:      SpO2: 93% 95% 95% 95%    Intake/Output Summary (Last 24 hours) at 07/04/16 1136 Last data filed at 07/04/16 0900  Gross per 24 hour  Intake 2033.33 ml  Output    300 ml  Net 1733.33 ml    LABS: Basic Metabolic Panel:  Recent Labs  07/03/16 1818 07/04/16 0221  NA 142 139  K 3.5 4.5  CL 111 111  CO2 19* 19*  GLUCOSE 129* 141*  BUN 18 19  CREATININE 1.32* 1.05  CALCIUM 8.8* 8.8*  MG  --  1.9  PHOS  --  1.8*   Liver Function Tests:  Recent Labs  07/03/16 1818 07/04/16 0221  AST 27 29  ALT 29 30  ALKPHOS 63 57  BILITOT 0.6 0.7  PROT 6.3* 6.2*  ALBUMIN 3.3* 3.5   No results for input(s): LIPASE, AMYLASE in the last 72 hours. CBC:  Recent Labs  07/03/16 1818 07/04/16 0221  WBC 11.0* 19.0*  NEUTROABS 6.7  --   HGB 15.3 13.9  HCT 46.6 42.6  MCV 92.8 93.2  PLT 275 293   Cardiac Enzymes:  Recent Labs  07/04/16 07/04/16 0221 07/04/16 0547  TROPONINI 0.68* 0.52* 0.31*   BNP: Invalid input(s): POCBNP D-Dimer: No results for  input(s): DDIMER in the last 72 hours. Hemoglobin A1C: No results for input(s): HGBA1C in the last 72 hours. Fasting Lipid Panel:  Recent Labs  07/04/16 0221  CHOL 204*  HDL 54  LDLCALC 139*  TRIG 57  CHOLHDL 3.8   Thyroid Function Tests:  Recent Labs  07/04/16 0021  TSH 0.812   Anemia Panel: No results for input(s): VITAMINB12, FOLATE, FERRITIN, TIBC, IRON, RETICCTPCT in the last 72 hours.  RADIOLOGY: Dg Chest Port 1 View  07/03/2016  CLINICAL DATA:  Shortness of breath. EXAM: PORTABLE CHEST 1 VIEW COMPARISON:  None. FINDINGS: Heart size is normal. Aortic atherosclerosis noted. No pleural effusion identified. Atelectasis is noted in the left lung base. No airspace consolidation. IMPRESSION: 1. Left base atelectasis. Electronically Signed   By: Kerby Moors M.D.   On: 07/03/2016 18:12    PHYSICAL EXAM General: NAD Neck: No JVD, no thyromegaly or thyroid nodule.  Lungs: Clear to auscultation bilaterally with normal respiratory effort. CV: Nondisplaced PMI.  Heart regular S1/S2, no S3/S4, no murmur.  No peripheral edema.  No carotid bruit.  Normal pedal pulses.  Abdomen: Soft, nontender, no hepatosplenomegaly, no distention.  Neurologic: Alert and oriented x 3.  Psych: Normal affect. Extremities: No clubbing or cyanosis.   TELEMETRY: Reviewed telemetry pt in NSR  ASSESSMENT AND PLAN: 65 yo with anaphylaxis from bee sting, respiratory arrest and syncope requiring epinephrine.  He was hypotensive and hypoxemic.  Troponin was mildly elevated after this event, peak 0.86 and down to 0.31 today.  I agree with Dr Radford Pax that this likely represents demand ischemia in setting of hypotension and hypoxemia.   - Echo today => if this is ok, can go home.  - Given family history of CAD, would be reasonable to get Cardiolite as outpatient.   Loralie Champagne 07/04/2016 11:40 AM

## 2016-07-04 NOTE — Discharge Instructions (Signed)

## 2016-07-04 NOTE — Discharge Summary (Signed)
Physician Discharge Summary  Andrew Heath [id number] DOB: [birth year] DOA: 07/03/2016  PCP: No primary care provider on file.  Admit date: 07/03/2016 Discharge date: 07/04/2016  Time spent: 45 minutes  Recommendations for Outpatient Follow-up:  Patient will be discharged to home.  Patient will need to follow up with primary care provider within one week of discharge.  Follow up with cardiology for stress test. Patient should continue medications as prescribed.  Patient should follow a heart healthy diet.   Discharge Diagnoses:  Active Problems:   Chest pain   Anaphylactic reaction   Hypercholesteremia   Anaphylactic reaction to bee sting   Acute renal injury [facility])   Discharge Condition: Stable  Diet recommendation: heart healthy  Filed Weights   07/03/16 1747  Weight: 99.791 kg (220 lb)    History of present illness:  On 07/03/2016 by Dr. Toy Baker Andrew Heath is a 65 y.o. male with medical history significant of mild hypercholesteremia   Presented after he was stung by P today while mowing the yard. Family noticed him to be pale and diaphoretic With hives EMS was called and patient was noted to be hypotensive incontinent of bowels and bladder. Wife noted he was turning blue. He was pointing to his chest. She gave him liquid benadryl.  He states he was very confused out of strength and felt he could not get any air.  Patient received 0.3 of EPinephrine at 1647 thereafter he developed chest pain received aspirin 300 2450 of Benadryl after that hives have resolved he was able to speak in complete sentences and was brought to emergency department.  Patient states he has been bit by yellow jacket in the past and developed milder reaction. Patient reports that he is very active placed tenderness and does not have any exertional chest pain or shortness of breath at baseline. Wife states that a year ago he collapsed while playing tennis but patient states  that was secondary to heat exertion. 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 sting with associated hypotension -Patient with facial swelling as well as had some problems breathing prior to admission -Patient was given epinephrine injection prior to arrival to the ED -Placed on Solu-Medrol, Benadryl, famotidine -Currently no respiratory compromise, facial swelling has improved, blood pressure has stabilized  Chest pain/Elevated troponin -Resolved, likely secondary to the above and demand ischemia -Troponin peak at 0.86, currently trending downward, 0.31 -EKG showed no signs of ischemia -Cardiology consulted and appreciated did not recommend IV heparin, felt this to not represent acute coronary syndrome, commended obtaining echocardiogram and possibly outpatient nuclear stress test -Echocardiogram: EF 65-70%, mildly elevated gradient across aortic valve. Normal RV size and systolic function -Continue aspirin  Hyperlipidemia -Lipid panel shows TC 204, TG 57, HDL 54, LDL 139 -Continue omega-3 fatty fish oil, may want to discuss statin medications with PCP  Acute kidney injury -Creatinine 1.32 upon admission, currently 1.05 -Likely secondary to hypotension and anaphylactic reaction -Repeat BMP in 1 week  Leukocytosis -Secondary to the above and steroids -Recommend repeat CBC in 1 week  Procedures: Echocardiogram  Consultations: Cardiology  Discharge Exam: Filed Vitals:   07/04/16 0749 07/04/16 1139  BP: 109/56 99/85  Pulse: 58 67  Temp: 97.8 F (36.6 C) 98.5 F (36.9 C)  Resp: 18 18     General: Well developed, well nourished, NAD, appears stated age  HEENT: NCAT, PERRLA, EOMI, Anicteic Sclera, mucous membranes  moist.  Neck: Supple, no JVD, no masses  Cardiovascular: S1 S2 auscultated, no rubs, murmurs or gallops. Regular rate and rhythm.  Respiratory: Clear to  auscultation bilaterally with equal chest rise  Abdomen: Soft, nontender, nondistended, + bowel sounds, no HSM   Extremities: warm dry without cyanosis clubbing or edema  Neuro: AAOx3, nonfocal  Skin: Without rashes exudates or nodules  Psych: Normal affect and demeanor with intact judgement and insight  Discharge Instructions      Discharge Instructions    Discharge instructions    Complete by:  As directed   Patient will be discharged to home.  Patient will need to follow up with primary care provider within one week of discharge.  Follow up with cardiology for stress test. Patient should continue medications as prescribed.  Patient should follow a heart healthy diet.            Medication List    TAKE these medications        aspirin 325 MG EC tablet  Take 650 mg by mouth 2 (two) times daily as needed for pain.     aspirin 81 MG EC tablet  Take 1 tablet (81 mg total) by mouth daily.     Biotin 1000 MCG tablet  Take 1,000 mcg by mouth daily.     Cinnamon 500 MG capsule  Take 500 mg by mouth 2 (two) times daily.     EPINEPHrine 0.3 mg/0.3 mL Soaj injection  Commonly known as:  EPI-PEN  Inject 0.3 mLs (0.3 mg total) into the muscle as needed (as needed for signs of anaphylaxys, call md prior to administratin, have this available on the floor).     famotidine 20 MG tablet  Commonly known as:  PEPCID  Take 1 tablet (20 mg total) by mouth 2 (two) times daily.     Fish Oil 1000 MG Caps  Take 2,000 mg by mouth 2 (two) times daily.     multivitamin tablet  Take 1 tablet by mouth daily.     NIACIN PO  Take 1 tablet by mouth daily.     predniSONE 10 MG tablet  Commonly known as:  DELTASONE  Take 1 tablet (10 mg total) by mouth daily with breakfast. Take  40mg  (4 tabs) x 2 days, then taper to 30mg  (3 tabs) x 2 days, then 20mg  (2 tabs) x 2 days, then 10mg  (1 tab) x 2days, then OFF.     PROBIOTIC PO  Take 1 tablet by mouth daily.     vitamin C 1000 MG tablet    Take 1,000 mg by mouth daily.     zinc gluconate 50 MG tablet  Take 50 mg by mouth daily.       Allergies  Allergen Reactions  . Bee Venom Anaphylaxis   Follow-up Information    Follow up with Fransico Him, MD.   Specialty:  Cardiology   Why:  our office will call you with the date and time and date and time for non exercise stress test to evaluated your coronary arteries.     Contact information:   [id number] [place] [address] [phone]       Follow up with Primary Care Physician. Schedule an appointment as soon as possible for a visit in 1 week.   Why:  Hospital follow up       The results of significant diagnostics from this hospitalization (including imaging, microbiology, ancillary and laboratory) are listed below for reference.  Significant Diagnostic Studies: Dg Chest Port 1 View  07/03/2016  CLINICAL DATA:  Shortness of breath. EXAM: PORTABLE CHEST 1 VIEW COMPARISON:  None. FINDINGS: Heart size is normal. Aortic atherosclerosis noted. No pleural effusion identified. Atelectasis is noted in the left lung base. No airspace consolidation. IMPRESSION: 1. Left base atelectasis. Electronically Signed   By: Kerby Moors M.D.   On: 07/03/2016 18:12    Microbiology: Recent Results (from the past 240 hour(s))  MRSA PCR Screening     Status: None   Collection Time: 07/03/16 11:05 PM  Result Value Ref Range Status   MRSA by PCR NEGATIVE NEGATIVE Final    Comment:        The GeneXpert MRSA Assay (FDA approved for NASAL specimens only), is one component of a comprehensive MRSA colonization surveillance program. It is not intended to diagnose MRSA infection nor to guide or monitor treatment for MRSA infections.      Labs: Basic Metabolic Panel:  Recent Labs Lab 07/03/16 1818 07/04/16 0221  NA 142 139  K 3.5 4.5  CL 111 111  CO2 19* 19*  GLUCOSE 129* 141*  BUN 18 19  CREATININE 1.32* 1.05  CALCIUM 8.8* 8.8*  MG  --  1.9  PHOS   --  1.8*   Liver Function Tests:  Recent Labs Lab 07/03/16 1818 07/04/16 0221  AST 27 29  ALT 29 30  ALKPHOS 63 57  BILITOT 0.6 0.7  PROT 6.3* 6.2*  ALBUMIN 3.3* 3.5   No results for input(s): LIPASE, AMYLASE in the last 168 hours. No results for input(s): AMMONIA in the last 168 hours. CBC:  Recent Labs Lab 07/03/16 1818 07/04/16 0221  WBC 11.0* 19.0*  NEUTROABS 6.7  --   HGB 15.3 13.9  HCT 46.6 42.6  MCV 92.8 93.2  PLT 275 293   Cardiac Enzymes:  Recent Labs Lab 07/03/16 2022 07/04/16 07/04/16 0221 07/04/16 0547  TROPONINI 0.86* 0.68* 0.52* 0.31*   BNP: BNP (last 3 results) No results for input(s): BNP in the last 8760 hours.  ProBNP (last 3 results) No results for input(s): PROBNP in the last 8760 hours.  CBG: No results for input(s): GLUCAP in the last 168 hours.     SignedCristal Ford  [facility] 07/04/2016, 4:00 PM

## 2016-07-04 NOTE — Progress Notes (Signed)
Echo tech contacted. Made aware pt has pending discharge after echo done. Per Echo tech, will re-prioritize to get to pt when they can.

## 2016-07-04 NOTE — Progress Notes (Signed)
Echocardiogram 2D Echocardiogram has been performed.  Andrew Heath 07/04/2016, 2:20 PM

## 2016-07-20 ENCOUNTER — Telehealth (HOSPITAL_COMMUNITY): Payer: Self-pay | Admitting: *Deleted

## 2016-07-20 NOTE — Telephone Encounter (Signed)
Patient given detailed instructions per Myocardial Perfusion Study Information Sheet for the test on 07/23/16 at 1100. Patient notified to arrive 15 minutes early and that it is imperative to arrive on time for appointment to keep from having the test rescheduled.  If you need to cancel or reschedule your appointment, please call the office within 24 hours of your appointment. Failure to do so may result in a cancellation of your appointment, and a $50 no show fee. Patient verbalized understanding.Jibril Mcminn, Ranae [place]

## 2016-07-23 ENCOUNTER — Ambulatory Visit (HOSPITAL_COMMUNITY): Payer: Managed Care, Other (non HMO) | Attending: Cardiology

## 2016-07-23 DIAGNOSIS — W57XXXA Bitten or stung by nonvenomous insect and other nonvenomous arthropods, initial encounter: Secondary | ICD-10-CM | POA: Diagnosis not present

## 2016-07-23 DIAGNOSIS — R778 Other specified abnormalities of plasma proteins: Secondary | ICD-10-CM

## 2016-07-23 DIAGNOSIS — R7989 Other specified abnormal findings of blood chemistry: Secondary | ICD-10-CM | POA: Diagnosis not present

## 2016-07-23 DIAGNOSIS — R9439 Abnormal result of other cardiovascular function study: Secondary | ICD-10-CM | POA: Diagnosis not present

## 2016-07-23 DIAGNOSIS — T782XXA Anaphylactic shock, unspecified, initial encounter: Secondary | ICD-10-CM | POA: Insufficient documentation

## 2016-07-23 LAB — MYOCARDIAL PERFUSION IMAGING
CHL CUP NUCLEAR SDS: 0
CHL CUP NUCLEAR SRS: 9
CHL CUP RESTING HR STRESS: 51 {beats}/min
CSEPED: 9 min
CSEPEW: 10.1 METS
CSEPHR: 102 %
CSEPPHR: 139 {beats}/min
Exercise duration (sec): 0 s
LV dias vol: 122 mL (ref 62–150)
LV sys vol: 51 mL
MPHR: 154 {beats}/min
RATE: 0.42
SSS: 9
TID: 1.01

## 2016-07-23 MED ORDER — TECHNETIUM TC 99M TETROFOSMIN IV KIT
32.4000 | PACK | Freq: Once | INTRAVENOUS | Status: AC | PRN
  Administered 2016-07-23: 32 via INTRAVENOUS
  Filled 2016-07-23: qty 32

## 2016-07-23 MED ORDER — TECHNETIUM TC 99M TETROFOSMIN IV KIT
10.2000 | PACK | Freq: Once | INTRAVENOUS | Status: AC | PRN
  Administered 2016-07-23: 10 via INTRAVENOUS
  Filled 2016-07-23: qty 10

## 2016-08-02 ENCOUNTER — Encounter: Payer: Self-pay | Admitting: Physician Assistant

## 2016-08-02 ENCOUNTER — Ambulatory Visit (INDEPENDENT_AMBULATORY_CARE_PROVIDER_SITE_OTHER): Payer: Managed Care, Other (non HMO) | Admitting: Physician Assistant

## 2016-08-02 VITALS — BP 140/72 | HR 60 | Ht 68.0 in | Wt 232.8 lb

## 2016-08-02 DIAGNOSIS — R079 Chest pain, unspecified: Secondary | ICD-10-CM

## 2016-08-02 DIAGNOSIS — T782XXD Anaphylactic shock, unspecified, subsequent encounter: Secondary | ICD-10-CM

## 2016-08-02 DIAGNOSIS — E7800 Pure hypercholesterolemia, unspecified: Secondary | ICD-10-CM

## 2016-08-02 NOTE — Patient Instructions (Signed)
Medication Instructions: Your physician recommends that you continue on your current medications as directed. Please refer to the Current Medication list given to you today.   Labwork: NONE ORDERED  Procedures/Testing: NONE ORDERED  Follow-Up: Your physician wants you to follow-up in 1 YEAR with Dr. Radford Pax. If you need assistance sooner contact the office. You will receive a reminder letter in the mail two months in advance. If you don't receive a letter, please call our office to schedule the follow-up appointment.   Any Additional Special Instructions Will Be Listed Below (If Applicable).     If you need a refill on your cardiac medications before your next appointment, please call your pharmacy.

## 2016-08-02 NOTE — Progress Notes (Signed)
Cardiology Office Note    Date:  08/02/2016   ID:  Andrew Heath, DOB 1951/05/11, MRN [id number]  PCP:  No primary care provider on file.  Cardiologist: Dr. Radford Pax  Chief Complaint  Patient presents with  . Follow-up    History of Present Illness:  Andrew Heath is a 65 y.o. male  With history of HTN and hyperlipidemia diet controlled. He was admitted with severe anaphylaxis secondary to bee sting with associated hypotension and near arrest treated with epinephrine with residual facial swelling. He had chest pain with troponin Of 0.15 and 0.86  Was felt likely related to demand ischemia in the setting of acute anaphylaxis, hypertension and epinephrine.  2-D echo was done and showed vigorous LV function EF 65-70% with indeterminate diastolic function. Nuclear stress test showed a small defect of mild severity present in the apex location no reversible ischemia LVEF 58% with normal wall motion.  Patient has had no further chest pain. He played tennis for 2-1/2 hours yesterday in the extreme heat. He denies chest pain, palpitations, dyspnea, dyspnea on exertion, dizziness or presyncope.   Past Medical History:  Diagnosis Date  . High cholesterol   . Hypertension     Past Surgical History:  Procedure Laterality Date  . SPLENECTOMY      Current Medications: Outpatient Medications Prior to Visit  Medication Sig Dispense Refill  . Ascorbic Acid (VITAMIN C) 1000 MG tablet Take 1,000 mg by mouth daily.    Marland Kitchen aspirin 325 MG EC tablet Take 650 mg by mouth 2 (two) times daily as needed for pain.    Marland Kitchen aspirin EC 81 MG EC tablet Take 1 tablet (81 mg total) by mouth daily. 30 tablet 0  . Biotin 1000 MCG tablet Take 1,000 mcg by mouth daily.    . Cinnamon 500 MG capsule Take 500 mg by mouth 2 (two) times daily.    Marland Kitchen EPINEPHrine 0.3 mg/0.3 mL IJ SOAJ injection Inject 0.3 mLs (0.3 mg total) into the muscle as needed (as needed for signs of anaphylaxys, call md prior to  administratin, have this available on the floor). 1 Device 2  . famotidine (PEPCID) 20 MG tablet Take 1 tablet (20 mg total) by mouth 2 (two) times daily. 30 tablet 0  . Multiple Vitamin (MULTIVITAMIN) tablet Take 1 tablet by mouth daily.    Marland Kitchen NIACIN PO Take 1 tablet by mouth daily.    . Omega-3 Fatty Acids (FISH OIL) 1000 MG CAPS Take 2,000 mg by mouth 2 (two) times daily.     . Probiotic Product (PROBIOTIC PO) Take 1 tablet by mouth daily.    Marland Kitchen zinc gluconate 50 MG tablet Take 50 mg by mouth daily.    . predniSONE (DELTASONE) 10 MG tablet Take 1 tablet (10 mg total) by mouth daily with breakfast. Take  40mg  (4 tabs) x 2 days, then taper to 30mg  (3 tabs) x 2 days, then 20mg  (2 tabs) x 2 days, then 10mg  (1 tab) x 2days, then OFF. (Patient not taking: Reported on 08/02/2016) 20 tablet 0   No facility-administered medications prior to visit.      Allergies:   Bee venom   Social History   Social History  . Marital status: Unknown    Spouse name: N/A  . Number of children: N/A  . Years of education: N/A   Social History Main Topics  . Smoking status: Never Smoker  . Smokeless tobacco: Never Used  . Alcohol use Yes  Comment: socially  . Drug use: No  . Sexual activity: Not Asked   Other Topics Concern  . None   Social History Narrative  . None     Family History:  The patient's   family history includes CAD in his other; Diabetes in his mother; Rheumatic fever in his father.   ROS:   Please see the history of present illness.    Review of Systems  Constitution: Negative.  HENT: Negative.   Cardiovascular: Negative.   Respiratory: Negative.   Endocrine: Negative.   Hematologic/Lymphatic: Negative.   Musculoskeletal: Negative.   Gastrointestinal: Negative.   Genitourinary: Negative.   Neurological: Negative.    All other systems reviewed and are negative.   PHYSICAL EXAM:   VS:  BP 140/72   Pulse 60   Ht 5\' 8"  (1.727 m)   Wt 232 lb 12.8 oz (105.6 kg)   BMI 35.40  kg/m   Physical Exam  GEN: Well nourished, well developed, in no acute distress  Neck: no JVD, carotid bruits, or masses Cardiac:RRR; no murmurs, rubs, or gallops  Respiratory:  clear to auscultation bilaterally, normal work of breathing GI: soft, nontender, nondistended, + BS Ext: without cyanosis, clubbing, or edema, Good distal pulses bilaterally MS: no deformity or atrophy  Skin: warm and dry, no rash Psych: euthymic mood, full affect  Wt Readings from Last 3 Encounters:  08/02/16 232 lb 12.8 oz (105.6 kg)  07/03/16 220 lb (99.8 kg)      Studies/Labs Reviewed:   EKG:  EKG is  Not ordered today.    Recent Labs: 07/04/2016: ALT 30; BUN 19; Creatinine, Ser 1.05; Hemoglobin 13.9; Magnesium 1.9; Platelets 293; Potassium 4.5; Sodium 139; TSH 0.812   Lipid Panel    Component Value Date/Time   CHOL 204 (H) 07/04/2016 0221   TRIG 57 07/04/2016 0221   HDL 54 07/04/2016 0221   CHOLHDL 3.8 07/04/2016 0221   VLDL 11 07/04/2016 0221   LDLCALC 139 (H) 07/04/2016 0221    Additional studies/ records that were reviewed today include:    Study Conclusions   - Left ventricle: The cavity size was normal. Wall thickness was   normal. Systolic function was vigorous. The estimated ejection   fraction was in the range of 65% to 70%. Indeterminant diastolic   function. Wall motion was normal; there were no regional wall   motion abnormalities. - Aortic valve: There was no stenosis. Mildly elevated gradient   across the aortic valve likely due to high flow (vigorous LV   contraction). - Left atrium: The atrium was mildly dilated. - Right ventricle: The cavity size was normal. Systolic function   was normal. - Right atrium: The atrium was mildly dilated. - Tricuspid valve: Peak RV-RA gradient (S): 20 mm Hg. - Pulmonary arteries: PA peak pressure: 28 mm Hg (S). - Systemic veins: IVC measured 2.3 cm with normal respirophasic   variation, suggesting RA pressure 8 mmHg. - Pericardium,  extracardiac: A trivial pericardial effusion was   identified posterior to the heart.   Impressions:   - Normal LV size with vigorous systolic function, EF [id number]. Mildly   elevated gradient across the aortic valve is likely due to high   flow with vigorous LV contraction. Normal RV size and systolic   function. Study Highlights     Nuclear stress EF: 58%.  Blood pressure demonstrated a normal response to exercise.  No T wave inversion was noted during stress.  Upsloping ST segment depression ST segment depression of  1 mm was noted during stress in the II, III, aVF, V6 and V5 leads.  Defect 1: There is a small defect of mild severity present in the apex location.  This is a low risk study.   Small size, mild intensity fixed apical artifact. No reversible ischemia. LVEF 58% with normal wall motion. Excellent exercise tolerance without ischemic EKG changes on the treadmill. This is a low risk study.        ASSESSMENT:    1. Anaphylactic reaction, subsequent encounter   2. Chest pain, unspecified chest pain type   3. Hypercholesteremia      PLAN:  In order of problems listed above:  Patient had a severe anaphylactic reaction after bee sting resulting in chest pain and elevated troponins that were felt to be demand ischemia. Nuclear stress test was considered low risk with no evidence of ischemia. 2-D echo was normal. Follow-up with Dr. Radford Pax in one year.  Chest pain in the setting of anaphylactic reaction without recurrence. Low risk nuclear stress test. Multiple cardiac risk factors for CAD. Control risk factors.  Hypercholesterolemia diet controlled    Medication Adjustments/Labs and Tests Ordered: Current medicines are reviewed at length with the patient today.  Concerns regarding medicines are outlined above.  Medication changes, Labs and Tests ordered today are listed in the Patient Instructions below. Patient Instructions  Medication Instructions: Your  physician recommends that you continue on your current medications as directed. Please refer to the Current Medication list given to you today.   Labwork: NONE ORDERED  Procedures/Testing: NONE ORDERED  Follow-Up: Your physician wants you to follow-up in 1 YEAR with Dr. Radford Pax. If you need assistance sooner contact the office. You will receive a reminder letter in the mail two months in advance. If you don't receive a letter, please call our office to schedule the follow-up appointment.   Any Additional Special Instructions Will Be Listed Below (If Applicable).     If you need a refill on your cardiac medications before your next appointment, please call your pharmacy.      Sumner Boast, PA-C  08/02/2016 11:23 AM    [facility] [place], [place], [place]  [address] Phone: [phone]; Fax: [phone]

## 2017-11-20 IMAGING — NM NM MISC PROCEDURE
6 series · 36 of 36 positions shown · non-contrast
Comparison: none

[Series 1: rest · 6.51mm/px · 6 of 64 frames shown]
[frame 6/64]
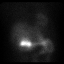
[frame 16/64]
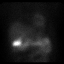
[frame 27/64]
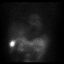
[frame 38/64]
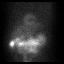
[frame 48/64]
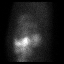
[frame 59/64]
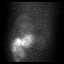

[Series 1: wbr_r-proj_st rest · 6.51mm/px · 6 of 64 frames shown]
[frame 6/64]
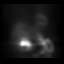
[frame 16/64]
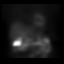
[frame 27/64]
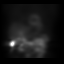
[frame 38/64]
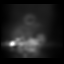
[frame 48/64]
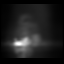
[frame 59/64]
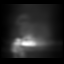

[Series 2: wbr_s-proj_st stress · 6.51mm/px · 6 of 512 frames shown (1 of 2)]
[frame 43/512]
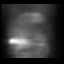
[frame 128/512]
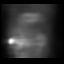
[frame 214/512]
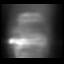
[frame 299/512]
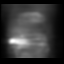
[frame 384/512]
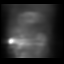
[frame 470/512]
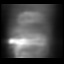

[Series 2: stress · 6.51mm/px · 6 of 64 frames shown (1 of 2)]
[frame 6/64]
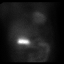
[frame 16/64]
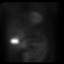
[frame 27/64]
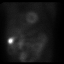
[frame 38/64]
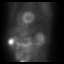
[frame 48/64]
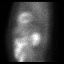
[frame 59/64]
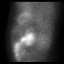

[Series 2: stress · 6.51mm/px · 6 of 512 frames shown (2 of 2)]
[frame 43/512]
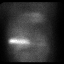
[frame 128/512]
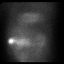
[frame 214/512]
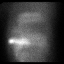
[frame 299/512]
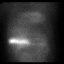
[frame 384/512]
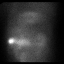
[frame 470/512]
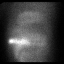

[Series 2: wbr_s-proj_st stress · 6.51mm/px · 6 of 64 frames shown (2 of 2)]
[frame 6/64]
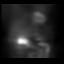
[frame 16/64]
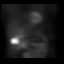
[frame 27/64]
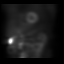
[frame 38/64]
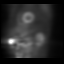
[frame 48/64]
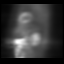
[frame 59/64]
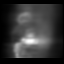

[36 of 36 positions shown; findings below may reference images not displayed]

Canned report from images found in remote index.

Refer to host system for actual result text.

## 2018-03-14 ENCOUNTER — Other Ambulatory Visit: Payer: Self-pay | Admitting: Family Medicine

## 2018-03-14 DIAGNOSIS — Z136 Encounter for screening for cardiovascular disorders: Secondary | ICD-10-CM

## 2018-03-14 DIAGNOSIS — F17210 Nicotine dependence, cigarettes, uncomplicated: Secondary | ICD-10-CM

## 2018-03-21 ENCOUNTER — Ambulatory Visit
Admission: RE | Admit: 2018-03-21 | Discharge: 2018-03-21 | Disposition: A | Discharge status: Home / Self Care | Payer: BLUE CROSS/BLUE SHIELD | Source: Ambulatory Visit | Attending: Family Medicine | Admitting: Family Medicine

## 2018-03-21 DIAGNOSIS — F17210 Nicotine dependence, cigarettes, uncomplicated: Secondary | ICD-10-CM

## 2018-03-21 DIAGNOSIS — Z136 Encounter for screening for cardiovascular disorders: Secondary | ICD-10-CM

## 2020-03-18 ENCOUNTER — Encounter: Payer: Self-pay | Admitting: Dermatology

## 2020-03-18 ENCOUNTER — Other Ambulatory Visit: Payer: Self-pay

## 2020-03-18 ENCOUNTER — Ambulatory Visit (INDEPENDENT_AMBULATORY_CARE_PROVIDER_SITE_OTHER): Payer: Medicare HMO | Admitting: Dermatology

## 2020-03-18 DIAGNOSIS — L570 Actinic keratosis: Secondary | ICD-10-CM | POA: Diagnosis not present

## 2020-03-18 DIAGNOSIS — D492 Neoplasm of unspecified behavior of bone, soft tissue, and skin: Secondary | ICD-10-CM

## 2020-03-18 DIAGNOSIS — D485 Neoplasm of uncertain behavior of skin: Secondary | ICD-10-CM

## 2020-03-23 NOTE — Progress Notes (Addendum)
   New Patient   Subjective  Andrew Heath is a 69 y.o. male who presents for the following: Skin Problem (Mid chest larger new spot no present now no treatment x years ).  Growths back, crusts arms Location:  Duration: months Quality: increased size             Associated Signs/Symptoms:stinging Modifying Factors:  Severity:  Timing: Context:    The following portions of the chart were reviewed this encounter and updated as appropriate:     Objective  Well appearing patient in no apparent distress; mood and affect are within normal limits.  All skin waist up examined.   Assessment & Plan  AK (actinic keratosis) (2) Left Forearm - Posterior; Right Forearm - Posterior  Destruction of lesion - Left Forearm - Posterior, Right Forearm - Posterior  Destruction method: cryotherapy   Informed consent: discussed and consent obtained   Lesion destroyed using liquid nitrogen: Yes   Outcome: patient tolerated procedure well with no complications    Neoplasm of skin (2) Left Upper Back  Skin / nail biopsy Type of biopsy: tangential   Informed consent: discussed and consent obtained   Timeout: patient name, date of birth, surgical site, and procedure verified   Procedure prep:  Patient was prepped and draped in usual sterile fashion Prep type:  Chlorhexidine Anesthesia: the lesion was anesthetized in a standard fashion   Anesthetic:  1% lidocaine w/ epinephrine 1-100,000 local infiltration Instrument used: flexible razor blade   Hemostasis achieved with: ferric subsulfate   Outcome: patient tolerated procedure well   Post-procedure details: sterile dressing applied and wound care instructions given   Dressing type: petrolatum   Additional details:  Patient identified lesion of concern.  Lesion identified by physician.  Specimen 1 - Surgical pathology Differential Diagnosis: R/O BCC V/S SCC Check Margins: No  Mid Back  Skin / nail biopsy Type of biopsy:  tangential   Informed consent: discussed and consent obtained   Timeout: patient name, date of birth, surgical site, and procedure verified   Procedure prep:  Patient was prepped and draped in usual sterile fashion Prep type:  Chlorhexidine Anesthesia: the lesion was anesthetized in a standard fashion   Anesthetic:  1% lidocaine w/ epinephrine 1-100,000 local infiltration Instrument used: flexible razor blade   Hemostasis achieved with: ferric subsulfate   Outcome: patient tolerated procedure well   Post-procedure details: sterile dressing applied and wound care instructions given   Dressing type: petrolatum   Additional details:  Patient identified lesion of concern.  Lesion identified by physician.  Specimen 2 - Surgical pathology Differential Diagnosis: R/O BCC V/S SCC Check Margins: No

## 2020-04-01 ENCOUNTER — Telehealth: Payer: Self-pay | Admitting: Dermatology

## 2020-04-02 NOTE — Telephone Encounter (Signed)
Phone to patient with his Pathology results.  Voicemail full.

## 2020-04-02 NOTE — Telephone Encounter (Signed)
Phone call from patient returning our call for his Pathology results.  Pathology results given to patient.

## 2021-06-17 ENCOUNTER — Other Ambulatory Visit: Payer: Self-pay

## 2021-06-17 ENCOUNTER — Ambulatory Visit (INDEPENDENT_AMBULATORY_CARE_PROVIDER_SITE_OTHER): Payer: BC Managed Care – PPO | Admitting: Dermatology

## 2021-06-17 DIAGNOSIS — L821 Other seborrheic keratosis: Secondary | ICD-10-CM

## 2021-06-17 DIAGNOSIS — Z85828 Personal history of other malignant neoplasm of skin: Secondary | ICD-10-CM

## 2021-06-17 DIAGNOSIS — Z86018 Personal history of other benign neoplasm: Secondary | ICD-10-CM | POA: Diagnosis not present

## 2021-06-17 DIAGNOSIS — Z1283 Encounter for screening for malignant neoplasm of skin: Secondary | ICD-10-CM | POA: Diagnosis not present

## 2021-06-17 DIAGNOSIS — Z87898 Personal history of other specified conditions: Secondary | ICD-10-CM

## 2021-06-17 DIAGNOSIS — L570 Actinic keratosis: Secondary | ICD-10-CM

## 2021-07-04 ENCOUNTER — Encounter: Payer: Self-pay | Admitting: Dermatology

## 2021-07-04 NOTE — Progress Notes (Signed)
   Follow-Up Visit   Subjective  Andrew Heath is a 70 y.o. male who presents for the following: Annual Exam (Here for skin exam. Concerns patient has lesion on back that worries him. X 6 months? History of skin cancers. BCC and atypical moles.). General skin examination, spots on back concern him the most Location:  Duration:  Quality:  Associated Signs/Symptoms: Modifying Factors:  Severity:  Timing: Context:   Objective  Well appearing patient in no apparent distress; mood and affect are within normal limits. No atypical pigmented lesions, no recurrent nonmelanoma skin cancer.  3 possible new carcinomas; see photographs.Suboptimal timing for patient to have biopsies done today so this will be scheduled after the summer.         Left Axilla No recurrent pigmentation  Mid Back Double brown textured flattopped 6 to 10 mm papules  Right Eyebrow 6 mm gritty pink crust.  Defer any treatment till the fall     All skin waist up examined.   Assessment & Plan    Screening for malignant neoplasm of skin  Annual skin examination.  History of atypical nevus Left Axilla  Check as needed change  History of basal cell carcinoma (BCC) of skin  Biopsies will be scheduled  Seborrheic keratosis Mid Back  Leave if stable  AK (actinic keratosis) Right Eyebrow  We will decide at follow-up visit whether to freeze or obtain biopsy.      I, Lavonna Monarch, MD, have reviewed all documentation for this visit.  The documentation on 07/04/21 for the exam, diagnosis, procedures, and orders are all accurate and complete.

## 2021-07-23 ENCOUNTER — Other Ambulatory Visit: Payer: Self-pay | Admitting: Physician Assistant

## 2021-07-23 DIAGNOSIS — R222 Localized swelling, mass and lump, trunk: Secondary | ICD-10-CM

## 2021-07-30 ENCOUNTER — Other Ambulatory Visit: Payer: Self-pay

## 2021-07-30 ENCOUNTER — Ambulatory Visit
Admission: RE | Admit: 2021-07-30 | Discharge: 2021-07-30 | Disposition: A | Discharge status: Home / Self Care | Payer: BC Managed Care – PPO | Source: Ambulatory Visit | Attending: Physician Assistant | Admitting: Physician Assistant

## 2021-07-30 DIAGNOSIS — R222 Localized swelling, mass and lump, trunk: Secondary | ICD-10-CM

## 2021-08-04 ENCOUNTER — Other Ambulatory Visit: Payer: Self-pay | Admitting: Family Medicine

## 2021-08-04 DIAGNOSIS — R222 Localized swelling, mass and lump, trunk: Secondary | ICD-10-CM

## 2021-08-08 ENCOUNTER — Inpatient Hospital Stay: Admission: RE | Admit: 2021-08-08 | Payer: BC Managed Care – PPO | Source: Ambulatory Visit

## 2021-08-26 ENCOUNTER — Other Ambulatory Visit: Payer: BC Managed Care – PPO

## 2021-09-14 ENCOUNTER — Inpatient Hospital Stay: Admission: RE | Admit: 2021-09-14 | Payer: BC Managed Care – PPO | Source: Ambulatory Visit

## 2021-09-22 ENCOUNTER — Ambulatory Visit (INDEPENDENT_AMBULATORY_CARE_PROVIDER_SITE_OTHER): Payer: Medicare HMO | Admitting: Dermatology

## 2021-09-22 ENCOUNTER — Other Ambulatory Visit: Payer: Self-pay

## 2021-09-22 DIAGNOSIS — L720 Epidermal cyst: Secondary | ICD-10-CM | POA: Diagnosis not present

## 2021-09-22 DIAGNOSIS — C44519 Basal cell carcinoma of skin of other part of trunk: Secondary | ICD-10-CM

## 2021-09-22 DIAGNOSIS — D485 Neoplasm of uncertain behavior of skin: Secondary | ICD-10-CM

## 2021-09-22 DIAGNOSIS — C44319 Basal cell carcinoma of skin of other parts of face: Secondary | ICD-10-CM | POA: Diagnosis not present

## 2021-09-22 DIAGNOSIS — D045 Carcinoma in situ of skin of trunk: Secondary | ICD-10-CM

## 2021-09-22 NOTE — Patient Instructions (Signed)

## 2021-09-28 ENCOUNTER — Telehealth: Payer: Self-pay

## 2021-09-28 NOTE — Telephone Encounter (Signed)
-----   Message from Lavonna Monarch, MD sent at 09/25/2021  7:26 AM EDT ----- Will likely require 2 surgical visits to take care of all the spots.

## 2021-09-28 NOTE — Telephone Encounter (Signed)
[place] office needs 2 surgical visits

## 2021-09-29 ENCOUNTER — Other Ambulatory Visit: Payer: Self-pay

## 2021-09-29 ENCOUNTER — Ambulatory Visit
Admission: RE | Admit: 2021-09-29 | Discharge: 2021-09-29 | Disposition: A | Discharge status: Home / Self Care | Payer: BC Managed Care – PPO | Source: Ambulatory Visit | Attending: Family Medicine | Admitting: Family Medicine

## 2021-09-29 DIAGNOSIS — R222 Localized swelling, mass and lump, trunk: Secondary | ICD-10-CM

## 2021-09-29 MED ORDER — IOPAMIDOL (ISOVUE-300) INJECTION 61%
75.0000 mL | Freq: Once | INTRAVENOUS | Status: AC | PRN
  Administered 2021-09-29: 75 mL via INTRAVENOUS

## 2021-09-29 NOTE — Telephone Encounter (Signed)
Patient left message on office voice mail that he was returning a telephone from yesterday about results and a future appointment.

## 2021-09-29 NOTE — Telephone Encounter (Signed)
Phone call to patient with his pathology results. Patient aware of results.  

## 2021-10-11 ENCOUNTER — Encounter: Payer: Self-pay | Admitting: Dermatology

## 2021-10-11 NOTE — Progress Notes (Signed)
   Follow-Up Visit   Subjective  Andrew Heath is a 70 y.o. male who presents for the following: Follow-up (Here to follow up on a few lesions. Biopsys on back and possible face. ).  Nonhealing crust on the back and face.  Also cyst on lower back. Location:  Duration:  Quality:  Associated Signs/Symptoms: Modifying Factors:  Severity:  Timing: Context:   Objective  Well appearing patient in no apparent distress; mood and affect are within normal limits. Mid Back Ill-defined 1.6 cm pearly crust       Left Lower Back 1 cm waxy crust       Right Posterior Mandible 1 cm pearly crust       Left Lower Back 1.2 mm cyst: Patient inquired about treatment options    All skin waist up examined.   Assessment & Plan    Neoplasm of uncertain behavior of skin (3) Mid Back  Skin / nail biopsy Type of biopsy: tangential   Informed consent: discussed and consent obtained   Timeout: patient name, date of birth, surgical site, and procedure verified   Anesthesia: the lesion was anesthetized in a standard fashion   Anesthetic:  1% lidocaine w/ epinephrine 1-100,000 local infiltration Instrument used: flexible razor blade   Hemostasis achieved with: ferric subsulfate   Outcome: patient tolerated procedure well   Post-procedure details: wound care instructions given    Specimen 1 - Surgical pathology Differential Diagnosis: scc v bcc  Check Margins: No  Left Lower Back  Skin / nail biopsy Type of biopsy: tangential   Informed consent: discussed and consent obtained   Timeout: patient name, date of birth, surgical site, and procedure verified   Anesthesia: the lesion was anesthetized in a standard fashion   Anesthetic:  1% lidocaine w/ epinephrine 1-100,000 local infiltration Instrument used: flexible razor blade   Hemostasis achieved with: ferric subsulfate   Outcome: patient tolerated procedure well   Post-procedure details: wound care instructions  given    Specimen 2 - Surgical pathology Differential Diagnosis: scc vs bcc  Check Margins: No  Right Posterior Mandible  Skin / nail biopsy Type of biopsy: tangential   Informed consent: discussed and consent obtained   Timeout: patient name, date of birth, surgical site, and procedure verified   Anesthesia: the lesion was anesthetized in a standard fashion   Anesthetic:  1% lidocaine w/ epinephrine 1-100,000 local infiltration Instrument used: flexible razor blade   Hemostasis achieved with: ferric subsulfate   Outcome: patient tolerated procedure well   Post-procedure details: wound care instructions given    Specimen 3 - Surgical pathology Differential Diagnosis: scc vs bcc  Check Margins: No  Epidermal cyst Left Lower Back  Can remove if patient chooses.  Prioritize any skin cancers.      I, Lavonna Monarch, MD, have reviewed all documentation for this visit.  The documentation on 10/11/21 for the exam, diagnosis, procedures, and orders are all accurate and complete.

## 2021-11-19 ENCOUNTER — Other Ambulatory Visit: Payer: Self-pay

## 2021-11-19 ENCOUNTER — Ambulatory Visit (INDEPENDENT_AMBULATORY_CARE_PROVIDER_SITE_OTHER): Payer: BC Managed Care – PPO | Admitting: Dermatology

## 2021-11-19 DIAGNOSIS — C44519 Basal cell carcinoma of skin of other part of trunk: Secondary | ICD-10-CM | POA: Diagnosis not present

## 2021-11-19 NOTE — Patient Instructions (Addendum)
Biopsy, Surgery (Curettage) & Surgery (Excision) Aftercare Instructions  1. Okay to remove bandage in 24 hours  2. Wash area with soap and water  3. Apply Vaseline to area twice daily until healed (Not Neosporin)  4. Okay to cover with a Band-Aid to decrease the chance of infection or prevent irritation from clothing; also it's okay to uncover lesion at home.  5. Suture instructions: return to our office in 7-10 or 10-14 days for a nurse visit for suture removal. Variable healing with sutures, if pain or itching occurs call our office. It's okay to shower or bathe 24 hours after sutures are given.  6. The following risks may occur after a biopsy, curettage or excision: bleeding, scarring, discoloration, recurrence, infection (redness, yellow drainage, pain or swelling).  7. For questions, concerns and results call our office at [place] before 4pm & Friday before 3pm. Biopsy results will be available in 1 week.   Call insurance company about cyst removal give them code [phone]

## 2021-12-09 ENCOUNTER — Encounter: Payer: Self-pay | Admitting: Dermatology

## 2021-12-09 NOTE — Progress Notes (Signed)
° °  Follow-Up Visit   Subjective  Andrew Heath is a 70 y.o. male who presents for the following: Procedure (Here for treatment bcc x 2 scc x 1 ).  Multiple biopsy-proven carcinomas, will start with largest lesion on back and. Location:  Duration:  Quality:  Associated Signs/Symptoms: Modifying Factors:  Severity:  Timing: Context:   Objective  Well appearing patient in no apparent distress; mood and affect are within normal limits. Mid Back Lesion identified by Dr.Adilynne Fitzwater and nurse in room.      All skin waist up examined.   Assessment & Plan    BCC (basal cell carcinoma), back Mid Back  Destruction of lesion Complexity: simple   Destruction method: electrodesiccation and curettage   Informed consent: discussed and consent obtained   Timeout:  patient name, date of birth, surgical site, and procedure verified Anesthesia: the lesion was anesthetized in a standard fashion   Anesthetic:  1% lidocaine w/ epinephrine 1-100,000 local infiltration Curettage performed in three different directions: Yes   Electrodesiccation performed over the curetted area: Yes   Curettage cycles:  3 Lesion length (cm):  2.5 Lesion width (cm):  2.5 Margin per side (cm):  0 Final wound size (cm):  2.5 Hemostasis achieved with:  ferric subsulfate and electrodesiccation Outcome: patient tolerated procedure well with no complications   Post-procedure details: sterile dressing applied and wound care instructions given   Dressing type: bandage and petrolatum   Additional details:  Wound innoculated with 5 fluorouracil solution.      I, Lavonna Monarch, MD, have reviewed all documentation for this visit.  The documentation on 12/09/21 for the exam, diagnosis, procedures, and orders are all accurate and complete.

## 2022-01-14 ENCOUNTER — Other Ambulatory Visit: Payer: Self-pay

## 2022-01-14 ENCOUNTER — Encounter: Payer: Self-pay | Admitting: Dermatology

## 2022-01-14 ENCOUNTER — Ambulatory Visit (INDEPENDENT_AMBULATORY_CARE_PROVIDER_SITE_OTHER): Payer: BC Managed Care – PPO | Admitting: Dermatology

## 2022-01-14 DIAGNOSIS — C44319 Basal cell carcinoma of skin of other parts of face: Secondary | ICD-10-CM

## 2022-01-14 NOTE — Patient Instructions (Signed)

## 2022-01-21 ENCOUNTER — Encounter: Payer: BC Managed Care – PPO | Admitting: Dermatology

## 2022-01-21 ENCOUNTER — Other Ambulatory Visit: Payer: Self-pay

## 2022-01-21 ENCOUNTER — Ambulatory Visit (INDEPENDENT_AMBULATORY_CARE_PROVIDER_SITE_OTHER): Payer: BC Managed Care – PPO

## 2022-01-21 DIAGNOSIS — Z4802 Encounter for removal of sutures: Secondary | ICD-10-CM

## 2022-01-21 NOTE — Progress Notes (Signed)
NTS Suture removal, No s/s of infection, path to pt. 

## 2022-02-06 ENCOUNTER — Encounter: Payer: Self-pay | Admitting: Dermatology

## 2022-02-06 NOTE — Progress Notes (Signed)
Follow-Up Visit   Subjective  Andrew Heath is a 71 y.o. male who presents for the following: Procedure (Right post mand.).  BCC right jawline Location:  Duration:  Quality:  Associated Signs/Symptoms: Modifying Factors:  Severity:  Timing: Context:   Objective  Well appearing patient in no apparent distress; mood and affect are within normal limits. Right Posterior Mandible Lesion identified by Dr.Arnold Kester and nurse in room.      A focused examination was performed including head and neck. Relevant physical exam findings are noted in the Assessment and Plan.   Assessment & Plan    Basal cell carcinoma (BCC) of jawline Right Posterior Mandible  Destruction of lesion Complexity: simple   Destruction method: electrodesiccation and curettage   Informed consent: discussed and consent obtained   Timeout:  patient name, date of birth, surgical site, and procedure verified Anesthesia: the lesion was anesthetized in a standard fashion   Anesthetic:  1% lidocaine w/ epinephrine 1-100,000 local infiltration Curettage performed in three different directions: Yes   Electrodesiccation performed over the curetted area: Yes   Curettage cycles:  3 Lesion length (cm):  0.9 Lesion width (cm):  0.9 Margin per side (cm):  0.1 Final wound size (cm):  1.1 Hemostasis achieved with:  ferric subsulfate and electrodesiccation Outcome: patient tolerated procedure well with no complications   Post-procedure details: wound care instructions given   Additional details:  Wound inoculated with 5% fluorouracil solution  Skin excision  Lesion length (cm):  0.9 Lesion width (cm):  0.9 Margin per side (cm):  0.1 Total excision diameter (cm):  1.1 Informed consent: discussed and consent obtained   Timeout: patient name, date of birth, surgical site, and procedure verified   Anesthesia: the lesion was anesthetized in a standard fashion   Anesthetic:  1% lidocaine w/ epinephrine 1-100,000  local infiltration Instrument used: #15 blade   Hemostasis achieved with: pressure and electrodesiccation   Outcome: patient tolerated procedure well with no complications   Post-procedure details: sterile dressing applied and wound care instructions given   Dressing type: bandage, petrolatum and pressure dressing    Skin repair Complexity:  Intermediate Final length (cm):  1.9 Informed consent: discussed and consent obtained   Timeout: patient name, date of birth, surgical site, and procedure verified   Procedure prep:  Patient was prepped and draped in usual sterile fashion (non sterile) Prep type:  Chlorhexidine Anesthesia: the lesion was anesthetized in a standard fashion   Reason for type of repair: reduce tension to allow closure and reduce the risk of dehiscence, infection, and necrosis   Undermining: edges undermined   Subcutaneous layers (deep stitches):  Suture size:  5-0 Suture type: Vicryl (polyglactin 910)   Fine/surface layer approximation (top stitches):  Suture size:  5-0 Suture type: nylon   Suture type comment:  Nylon Stitches: simple running   Suture removal (days):  7 Hemostasis achieved with: suture, pressure and electrodesiccation Outcome: patient tolerated procedure well with no complications   Post-procedure details: wound care instructions given   Post-procedure details comment:  Non sterile pressure  Additional details:  Vicryl x1 Ethilon x3  Specimen 1 - Surgical pathology Differential Diagnosis: R/O BCC - EXCISION [id number] Check Margins: YES - INFERIOR MARGIN STAINED  Curettage showed this to be a fairly deep lesion.  After curettage plus cautery narrow margin excision with layered closure was performed.      I, Lavonna Monarch, MD, have reviewed all documentation for this visit.  The documentation on 02/06/22 for the  exam, diagnosis, procedures, and orders are all accurate and complete.

## 2022-02-25 ENCOUNTER — Other Ambulatory Visit: Payer: Self-pay

## 2022-02-25 ENCOUNTER — Encounter: Payer: Self-pay | Admitting: Dermatology

## 2022-02-25 ENCOUNTER — Ambulatory Visit (INDEPENDENT_AMBULATORY_CARE_PROVIDER_SITE_OTHER): Payer: BC Managed Care – PPO | Admitting: Dermatology

## 2022-02-25 DIAGNOSIS — L570 Actinic keratosis: Secondary | ICD-10-CM | POA: Diagnosis not present

## 2022-02-25 DIAGNOSIS — D485 Neoplasm of uncertain behavior of skin: Secondary | ICD-10-CM

## 2022-02-25 DIAGNOSIS — D3617 Benign neoplasm of peripheral nerves and autonomic nervous system of trunk, unspecified: Secondary | ICD-10-CM

## 2022-02-25 DIAGNOSIS — D045 Carcinoma in situ of skin of trunk: Secondary | ICD-10-CM

## 2022-03-08 ENCOUNTER — Encounter: Payer: Self-pay | Admitting: Dermatology

## 2022-03-08 NOTE — Progress Notes (Signed)
? ?Follow-Up Visit ?  ?Subjective  ?Andrew Heath is a 72 y.o. male who presents for the following: Procedure (Patient here today for removal of neurofibroma on left mid back. ). ? ?Patient would like nodule on left lower back removed but he has biopsy proven carcinoma on back +2 newer crusts which will be taken care of first. ?Location:  ?Duration:  ?Quality:  ?Associated Signs/Symptoms: ?Modifying Factors:  ?Severity:  ?Timing: ?Context:  ? ?Objective  ?Well appearing patient in no apparent distress; mood and affect are within normal limits. ?Left Upper Back - Inferior ?Eroded 6 mm pink crust ? ? ? ? ? ? ?Left Upper Back - Superior ?Eroded 5 mm pink crust ? ? ? ? ? ? ?Left Lower Back ?Lesion identified by Dr.Shone Leventhal and nurse in room.   ? ?Left Lower Back ?2+ centimeter bulge, nontender, noninflamed left lower outer back.  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  Patient would like this removed. ? ? ? ?All skin waist up examined. ? ? ?Assessment & Plan  ? ? ?Neoplasm of uncertain behavior of skin (2) ?Left Upper Back - Inferior ? ?Skin / nail biopsy ?Type of biopsy: tangential   ?Informed consent: discussed and consent obtained   ?Timeout: patient name, date of birth, surgical site, and procedure verified   ?Anesthesia: the lesion was anesthetized in a standard fashion   ?Anesthetic:  1% lidocaine w/ epinephrine 1-100,000 local infiltration ?Instrument used: flexible razor blade   ?Hemostasis achieved with: ferric subsulfate and electrodesiccation   ?Outcome: patient tolerated procedure well   ?Post-procedure details: wound care instructions given   ? ?Destruction of lesion ?Complexity: simple   ?Destruction method: electrodesiccation and curettage   ?Informed consent: discussed and consent obtained   ?Timeout:  patient name, date of birth, surgical site, and procedure verified ?Anesthesia: the lesion was anesthetized in a standard fashion   ?Anesthetic:  1% lidocaine w/ epinephrine  1-100,000 local infiltration ?Curettage performed in three different directions: Yes   ?Electrodesiccation performed over the curetted area: Yes   ?Curettage cycles:  3 ?Margin per side (cm):  0.1 ?Final wound size (cm):  1.5 ?Hemostasis achieved with:  ferric subsulfate ?Outcome: patient tolerated procedure well with no complications   ?Post-procedure details: sterile dressing applied and wound care instructions given   ?Dressing type: bandage and petrolatum   ?Additional details:  Wound inoculated with 5% Fluorouracil.   ? ?Specimen 2 - Surgical pathology ?Differential Diagnosis: R/O BCC VS SCC - TXPBX ? ?Check Margins: No ? ?Left Upper Back - Superior ? ?Skin / nail biopsy ?Type of biopsy: tangential   ?Informed consent: discussed and consent obtained   ?Timeout: patient name, date of birth, surgical site, and procedure verified   ?Anesthesia: the lesion was anesthetized in a standard fashion   ?Anesthetic:  1% lidocaine w/ epinephrine 1-100,000 local infiltration ?Instrument used: flexible razor blade   ?Hemostasis achieved with: ferric subsulfate and electrodesiccation   ?Outcome: patient tolerated procedure well   ?Post-procedure details: wound care instructions given   ? ?Destruction of lesion ?Complexity: simple   ?Destruction method: electrodesiccation and curettage   ?Informed consent: discussed and consent obtained   ?Timeout:  patient name, date of birth, surgical site, and procedure verified ?Anesthesia: the lesion was anesthetized in a standard fashion   ?Anesthetic:  1% lidocaine w/ epinephrine 1-100,000 local infiltration ?Curettage performed in three different directions: Yes   ?Electrodesiccation performed over the curetted area: Yes   ?Curettage cycles:  3 ?Margin  per side (cm):  0.1 ?Final wound size (cm):  1.5 ?Hemostasis achieved with:  ferric subsulfate ?Outcome: patient tolerated procedure well with no complications   ?Post-procedure details: sterile dressing applied and wound care  instructions given   ?Dressing type: bandage and petrolatum   ?Additional details:  Wound inoculated with 5% Fluorouracil.   ? ?Specimen 1 - Surgical pathology ?Differential Diagnosis: R/O BCC VS SCC - TXPBX ? ?Check Margins: No ? ?After shave biopsy the base of these lesions was treated with curettage plus cautery. ? 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 ?Destruction of lesion ?Complexity: simple   ?Destruction method: electrodesiccation and curettage   ?Informed consent: discussed and consent obtained   ?Timeout:  patient name, date of birth, surgical site, and procedure verified ?Anesthesia: the lesion was anesthetized in a standard fashion   ?Anesthetic:  1% lidocaine w/ epinephrine 1-100,000 local infiltration ?Curettage performed in three different directions: Yes   ?Electrodesiccation performed over the curetted area: Yes   ?Curettage cycles:  3 ?Lesion length (cm):  1.3 ?Lesion width (cm):  1.3 ?Margin per side (cm):  0 ?Final wound size (cm):  1.3 ?Hemostasis achieved with:  ferric subsulfate ?Outcome: patient tolerated procedure well with no complications   ?Post-procedure details: sterile dressing applied and wound care instructions given   ?Dressing type: bandage and petrolatum   ?Additional details:  Wound inoculated with 5% Fluorouracil.   ? 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 ?Although scheduled for surgical removal of this lesion, he has a biopsy proven nonmelanoma skin cancer along with 2 other possible skin cancers on his back which will have priority. ? ? ? ? ? ?I, Lavonna Monarch, MD, have reviewed all documentation for this visit.  The documentation on 03/08/22 for the exam, diagnosis, procedures, and orders are all accurate and complete. ?

## 2022-04-22 ENCOUNTER — Encounter: Payer: Self-pay | Admitting: Dermatology

## 2022-04-22 ENCOUNTER — Ambulatory Visit (INDEPENDENT_AMBULATORY_CARE_PROVIDER_SITE_OTHER): Payer: BC Managed Care – PPO | Admitting: Dermatology

## 2022-04-22 DIAGNOSIS — D485 Neoplasm of uncertain behavior of skin: Secondary | ICD-10-CM

## 2022-04-22 DIAGNOSIS — L720 Epidermal cyst: Secondary | ICD-10-CM | POA: Diagnosis not present

## 2022-04-22 NOTE — Patient Instructions (Signed)

## 2022-05-04 ENCOUNTER — Ambulatory Visit: Payer: BC Managed Care – PPO

## 2022-05-09 ENCOUNTER — Encounter: Payer: Self-pay | Admitting: Dermatology

## 2022-05-09 NOTE — Progress Notes (Signed)
   Follow-Up Visit   Subjective  Andrew Heath is a 71 y.o. male who presents for the following: Procedure (Cyst/liopma on the back ).  Cyst upper back, patient requests removal Location:  Duration:  Quality:  Associated Signs/Symptoms: Modifying Factors:  Severity:  Timing: Context:   Objective  Well appearing patient in no apparent distress; mood and affect are within normal limits. Left Side Mid Back 2+ noninflamed soft subcutaneous nodule, epidermoid cyst versus lipoma.  Patient requests removal.  Told preoperatively risks of recurrence, unsightly scar, infection; he wished to proceed.    All skin waist up examined.   Assessment & Plan    Neoplasm of uncertain behavior of skin Left Side Mid Back  Skin excision  Lesion length (cm):  2.1 Lesion width (cm):  2.1 Margin per side (cm):  0 Total excision diameter (cm):  2.1 Informed consent: discussed and consent obtained   Timeout: patient name, date of birth, surgical site, and procedure verified   Anesthesia: the lesion was anesthetized in a standard fashion   Anesthetic:  1% lidocaine w/ epinephrine 1-100,000 local infiltration Instrument used: #15 blade   Hemostasis achieved with: pressure and electrodesiccation   Outcome: patient tolerated procedure well with no complications   Post-procedure details: sterile dressing applied and wound care instructions given   Dressing type: bandage, petrolatum and pressure dressing    Skin repair Complexity:  Intermediate Final length (cm):  2.1 Informed consent: discussed and consent obtained   Timeout: patient name, date of birth, surgical site, and procedure verified   Procedure prep:  Patient was prepped and draped in usual sterile fashion (non sterile) Prep type:  Chlorhexidine Anesthesia: the lesion was anesthetized in a standard fashion   Reason for type of repair: reduce tension to allow closure, reduce the risk of dehiscence, infection, and necrosis and reduce  subcutaneous dead space and avoid a hematoma   Undermining: edges undermined   Subcutaneous layers (deep stitches):  Suture size:  3-0 Suture type: Vicryl (polyglactin 910)   Fine/surface layer approximation (top stitches):  Suture size:  3-0 Suture type: nylon   Suture type comment:  Nylon Stitches: simple running   Suture removal (days):  14 Hemostasis achieved with: suture, pressure and electrodesiccation Outcome: patient tolerated procedure well with no complications   Post-procedure details: sterile dressing applied and wound care instructions given   Post-procedure details comment:  Non sterile pressure  Dressing type: bandage and petrolatum   Additional details:  3-0 Vicryl x 1 3-0 Ethilon x 4  Specimen 1 - Surgical pathology Differential Diagnosis: R/O Cyst - excision  Check Margins: No  2.1 x 0.6 cm fusiform incision, dissected out with deep wall removed in toto, layered closure.      I, Lavonna Monarch, MD, have reviewed all documentation for this visit.  The documentation on 05/09/22 for the exam, diagnosis, procedures, and orders are all accurate and complete.

## 2022-08-03 ENCOUNTER — Ambulatory Visit: Payer: BC Managed Care – PPO | Admitting: Dermatology

## 2023-01-27 IMAGING — CT CT CHEST W/ CM
1 series · 16 of 34 positions shown, 20 images · IV contrast (APPLIED)
Comparison: July 30, 2021.

CLINICAL DATA: Palpable mass over left chest.

EXAM:
CT CHEST WITH CONTRAST
TECHNIQUE: Multidetector CT imaging of the chest was performed during
intravenous contrast administration.
CONTRAST:  75mL BBHWS4-H00 IOPAMIDOL (BBHWS4-H00) INJECTION 61%

[Series 2: chest w/cm · axial · 0.73mm/px · z∈[-270,+8]mm · 16 of 157 slices shown, 20 images]
[im 12/157  mediastinal]
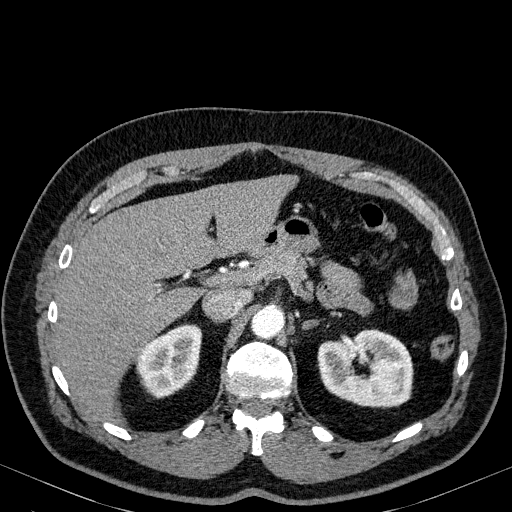
[im 12/157  lung]
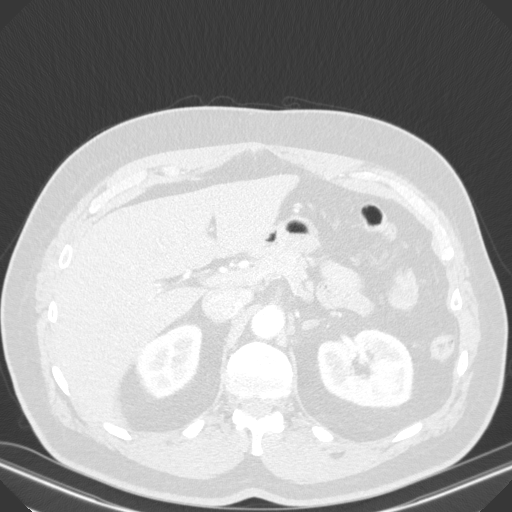
[im 24/157  lung]
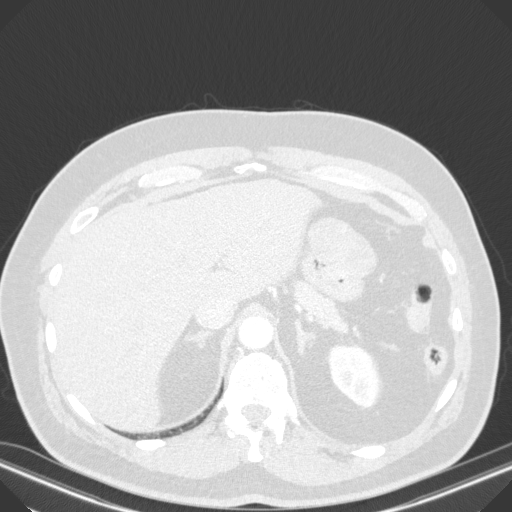
[im 32/157  lung]
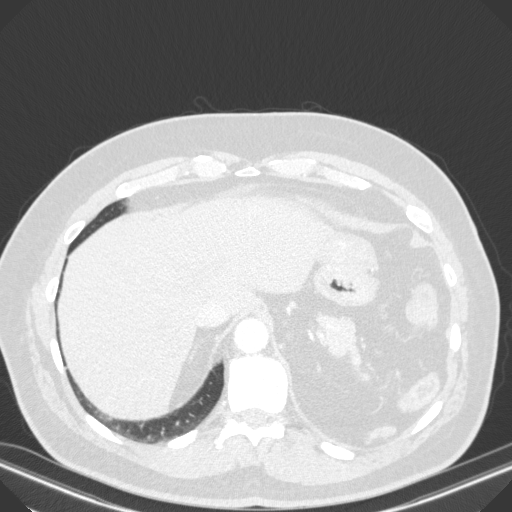
[im 41/157  lung]
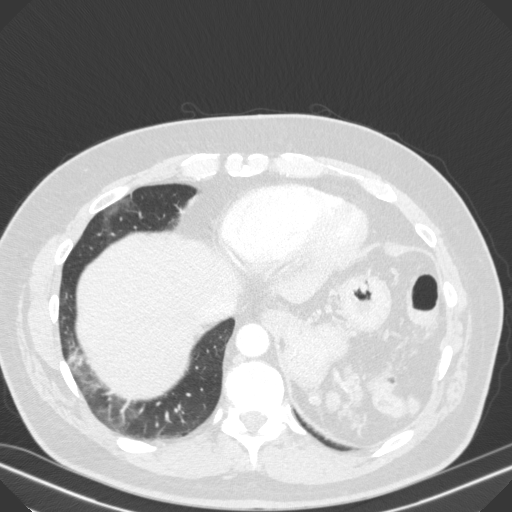
[im 53/157  mediastinal]
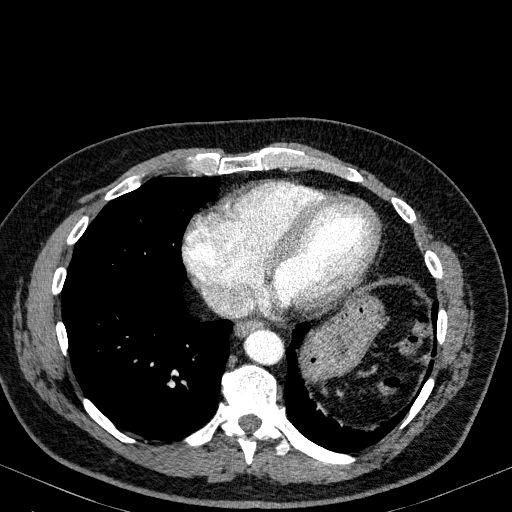
[im 53/157  lung]
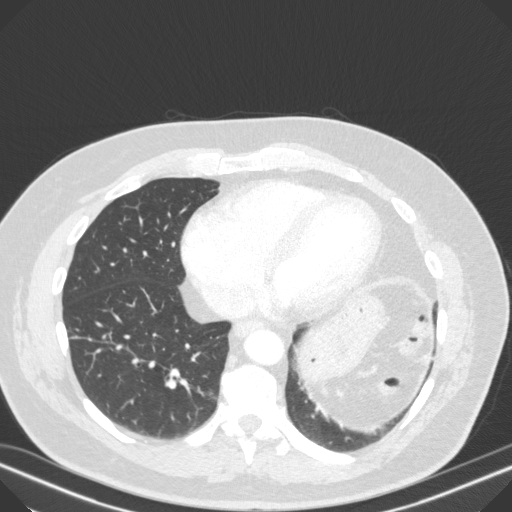
[im 63/157  lung]
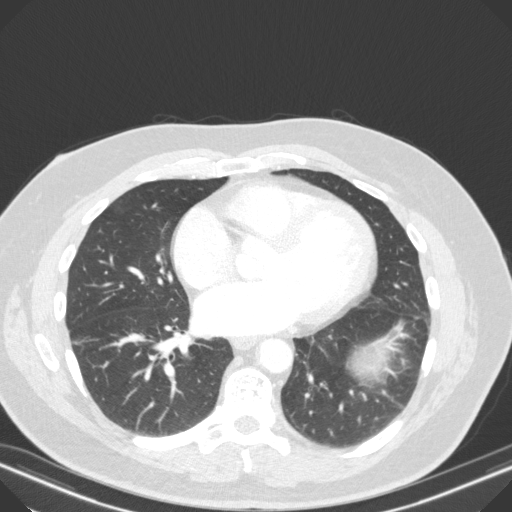
[im 70/157  lung]
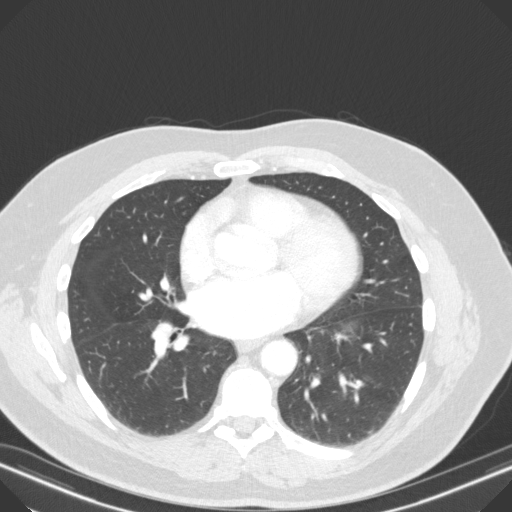
[im 76/157  lung]
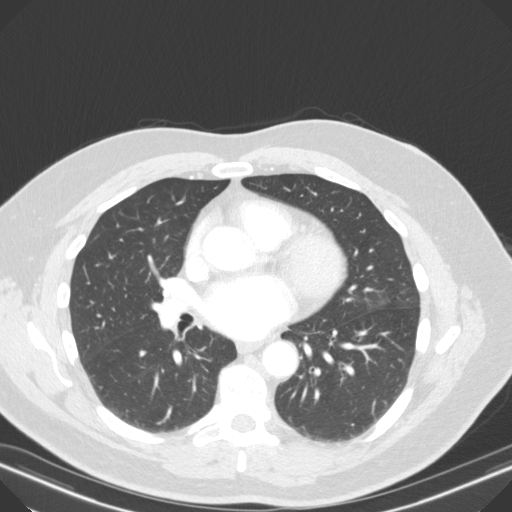
[im 84/157  mediastinal]
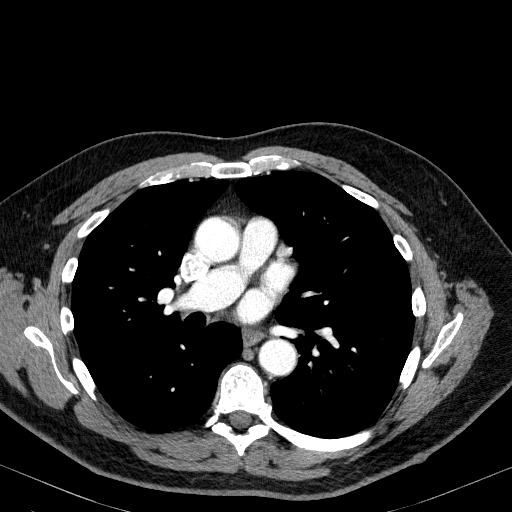
[im 84/157  lung]
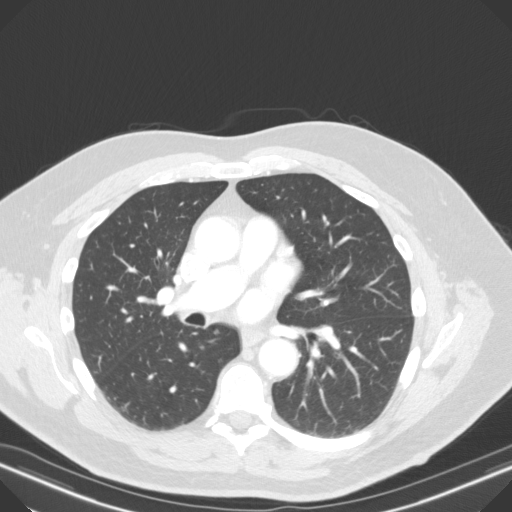
[im 93/157  lung]
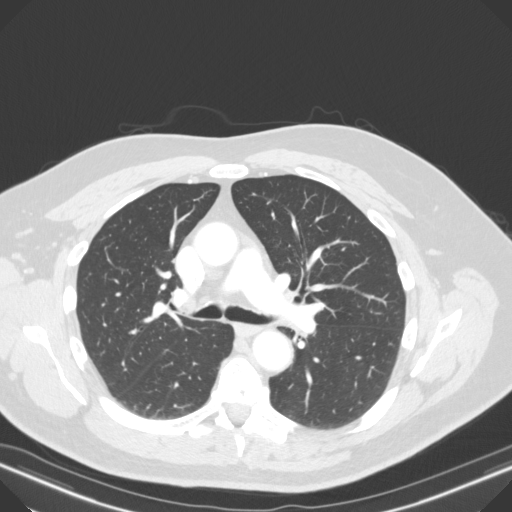
[im 99/157  lung]
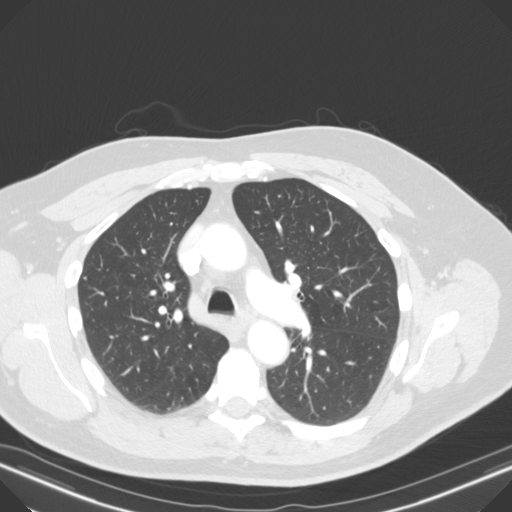
[im 110/157  lung]
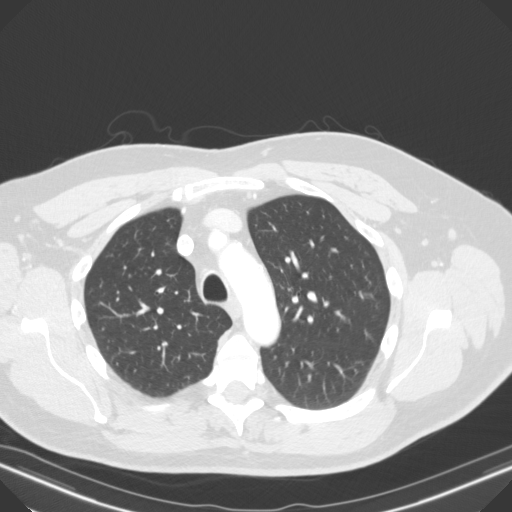
[im 122/157  mediastinal]
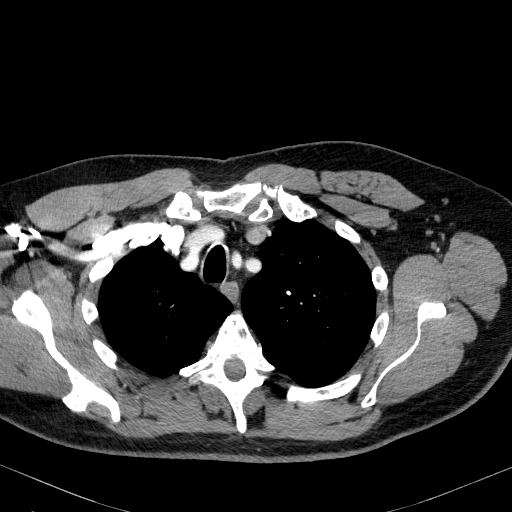
[im 122/157  lung]
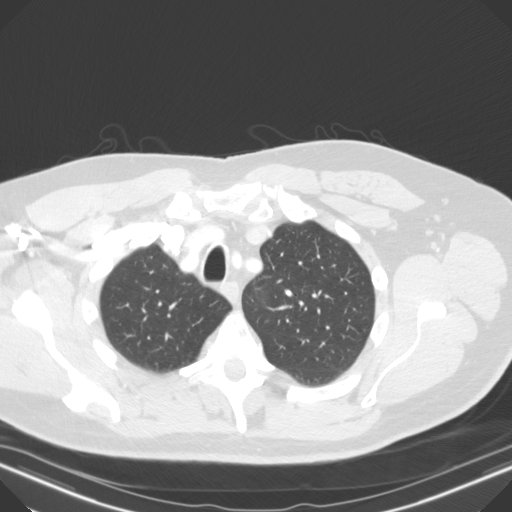
[im 128/157  lung]
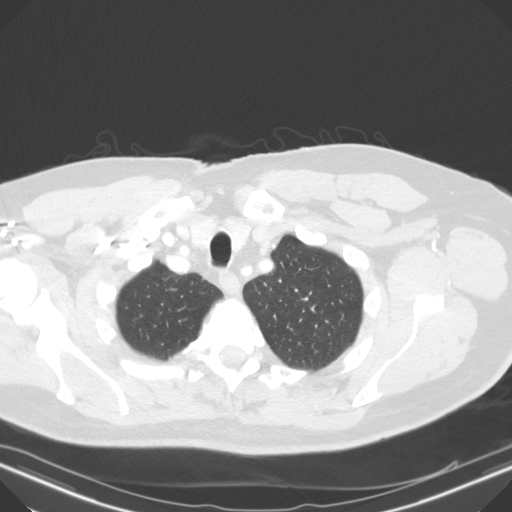
[im 139/157  lung]
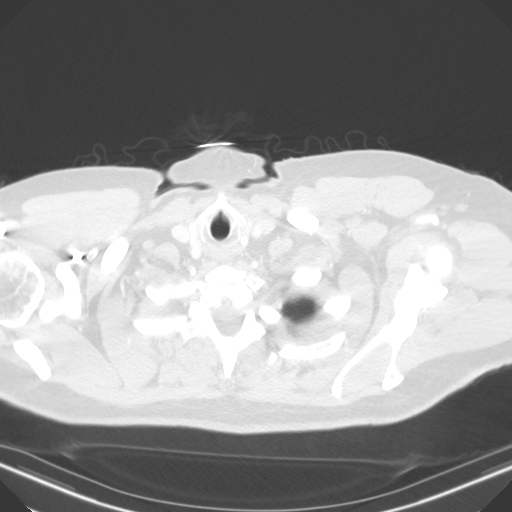
[im 151/157  lung]
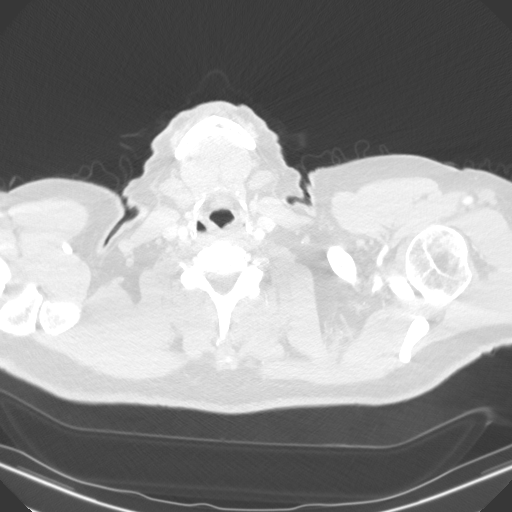

[16 of 34 positions shown; findings below may reference images not displayed]

FINDINGS: Cardiovascular: Atherosclerosis of thoracic aorta is noted without
aneurysm or dissection. Normal cardiac size. No pericardial
effusion.

Mediastinum/Nodes: No enlarged mediastinal, hilar, or axillary lymph
nodes. Thyroid gland, trachea, and esophagus demonstrate no
significant findings.

Lungs/Pleura: No pneumothorax or pleural effusion is noted. Minimal
bibasilar subsegmental atelectasis is noted.

Upper Abdomen: None.

Musculoskeletal: No chest wall abnormality. No acute or significant
osseous findings.
IMPRESSION: Minimal bibasilar subsegmental atelectasis.

Left posterior chest wall mass noted on prior ultrasound is not
definitively visualized on this study.

Aortic Atherosclerosis ([id number]).
